# Patient Record
Sex: Female | Born: 1965 | Race: White | Hispanic: No | Marital: Single | Smoking: Former smoker
Health system: Southern US, Community
[De-identification: ages and names within clinical notes are randomized; demographics above are authoritative.]

## PROBLEM LIST (undated history)

## (undated) DIAGNOSIS — E669 Obesity, unspecified: Secondary | ICD-10-CM

## (undated) DIAGNOSIS — K759 Inflammatory liver disease, unspecified: Secondary | ICD-10-CM

## (undated) DIAGNOSIS — E119 Type 2 diabetes mellitus without complications: Secondary | ICD-10-CM

## (undated) DIAGNOSIS — G4733 Obstructive sleep apnea (adult) (pediatric): Secondary | ICD-10-CM

## (undated) DIAGNOSIS — J449 Chronic obstructive pulmonary disease, unspecified: Secondary | ICD-10-CM

## (undated) DIAGNOSIS — K3184 Gastroparesis: Secondary | ICD-10-CM

## (undated) DIAGNOSIS — I6529 Occlusion and stenosis of unspecified carotid artery: Secondary | ICD-10-CM

## (undated) DIAGNOSIS — G4700 Insomnia, unspecified: Secondary | ICD-10-CM

## (undated) DIAGNOSIS — M1990 Unspecified osteoarthritis, unspecified site: Secondary | ICD-10-CM

## (undated) DIAGNOSIS — F32A Depression, unspecified: Secondary | ICD-10-CM

## (undated) DIAGNOSIS — K589 Irritable bowel syndrome without diarrhea: Secondary | ICD-10-CM

## (undated) DIAGNOSIS — M5430 Sciatica, unspecified side: Secondary | ICD-10-CM

## (undated) DIAGNOSIS — F419 Anxiety disorder, unspecified: Secondary | ICD-10-CM

## (undated) DIAGNOSIS — F329 Major depressive disorder, single episode, unspecified: Secondary | ICD-10-CM

## (undated) DIAGNOSIS — E785 Hyperlipidemia, unspecified: Secondary | ICD-10-CM

## (undated) DIAGNOSIS — K7460 Unspecified cirrhosis of liver: Secondary | ICD-10-CM

## (undated) DIAGNOSIS — I10 Essential (primary) hypertension: Secondary | ICD-10-CM

## (undated) DIAGNOSIS — J45909 Unspecified asthma, uncomplicated: Secondary | ICD-10-CM

## (undated) HISTORY — DX: Obstructive sleep apnea (adult) (pediatric): G47.33

## (undated) HISTORY — DX: Depression, unspecified: F32.A

## (undated) HISTORY — DX: Obesity, unspecified: E66.9

## (undated) HISTORY — DX: Type 2 diabetes mellitus without complications: E11.9

## (undated) HISTORY — DX: Irritable bowel syndrome, unspecified: K58.9

## (undated) HISTORY — DX: Insomnia, unspecified: G47.00

## (undated) HISTORY — DX: Essential (primary) hypertension: I10

## (undated) HISTORY — DX: Major depressive disorder, single episode, unspecified: F32.9

## (undated) HISTORY — DX: Hyperlipidemia, unspecified: E78.5

## (undated) HISTORY — PX: OTHER SURGICAL HISTORY: SHX169

## (undated) HISTORY — PX: WISDOM TOOTH EXTRACTION: SHX21

## (undated) HISTORY — DX: Gastroparesis: K31.84

## (undated) HISTORY — DX: Sciatica, unspecified side: M54.30

---

## 1998-10-23 ENCOUNTER — Emergency Department (HOSPITAL_COMMUNITY): Admission: EM | Admit: 1998-10-23 | Discharge: 1998-10-23 | Payer: Self-pay | Admitting: Emergency Medicine

## 1998-11-01 ENCOUNTER — Ambulatory Visit (HOSPITAL_COMMUNITY): Admission: RE | Admit: 1998-11-01 | Discharge: 1998-11-01 | Payer: Self-pay | Admitting: Gastroenterology

## 1998-11-05 ENCOUNTER — Emergency Department (HOSPITAL_COMMUNITY): Admission: EM | Admit: 1998-11-05 | Discharge: 1998-11-05 | Payer: Self-pay | Admitting: Emergency Medicine

## 1998-11-06 ENCOUNTER — Encounter: Payer: Self-pay | Admitting: Gastroenterology

## 1998-11-06 ENCOUNTER — Ambulatory Visit (HOSPITAL_COMMUNITY): Admission: RE | Admit: 1998-11-06 | Discharge: 1998-11-06 | Payer: Self-pay | Admitting: Gastroenterology

## 1999-01-25 ENCOUNTER — Ambulatory Visit (HOSPITAL_BASED_OUTPATIENT_CLINIC_OR_DEPARTMENT_OTHER): Admission: RE | Admit: 1999-01-25 | Discharge: 1999-01-25 | Payer: Self-pay | Admitting: Otolaryngology

## 1999-10-29 ENCOUNTER — Other Ambulatory Visit (HOSPITAL_COMMUNITY): Admission: RE | Admit: 1999-10-29 | Discharge: 1999-11-13 | Payer: Self-pay | Admitting: Psychiatry

## 2002-03-07 ENCOUNTER — Ambulatory Visit (HOSPITAL_BASED_OUTPATIENT_CLINIC_OR_DEPARTMENT_OTHER): Admission: RE | Admit: 2002-03-07 | Discharge: 2002-03-07 | Payer: Self-pay | Admitting: Family Medicine

## 2002-03-07 ENCOUNTER — Encounter: Payer: Self-pay | Admitting: Internal Medicine

## 2002-04-28 ENCOUNTER — Encounter: Payer: Self-pay | Admitting: Internal Medicine

## 2002-04-28 ENCOUNTER — Ambulatory Visit (HOSPITAL_BASED_OUTPATIENT_CLINIC_OR_DEPARTMENT_OTHER): Admission: RE | Admit: 2002-04-28 | Discharge: 2002-04-28 | Payer: Self-pay | Admitting: Family Medicine

## 2002-11-26 ENCOUNTER — Encounter: Payer: Self-pay | Admitting: Internal Medicine

## 2002-11-26 ENCOUNTER — Ambulatory Visit (HOSPITAL_BASED_OUTPATIENT_CLINIC_OR_DEPARTMENT_OTHER): Admission: RE | Admit: 2002-11-26 | Discharge: 2002-11-26 | Payer: Self-pay | Admitting: Family Medicine

## 2005-01-14 ENCOUNTER — Other Ambulatory Visit: Admission: RE | Admit: 2005-01-14 | Discharge: 2005-01-14 | Payer: Self-pay | Admitting: Obstetrics and Gynecology

## 2005-09-13 ENCOUNTER — Ambulatory Visit: Payer: Self-pay | Admitting: Gastroenterology

## 2005-10-14 ENCOUNTER — Ambulatory Visit: Payer: Self-pay | Admitting: Gastroenterology

## 2006-06-13 ENCOUNTER — Encounter: Payer: Self-pay | Admitting: Internal Medicine

## 2006-06-27 ENCOUNTER — Encounter: Admission: RE | Admit: 2006-06-27 | Discharge: 2006-06-27 | Payer: Self-pay | Admitting: Obstetrics and Gynecology

## 2006-08-21 ENCOUNTER — Other Ambulatory Visit: Admission: RE | Admit: 2006-08-21 | Discharge: 2006-08-21 | Payer: Self-pay | Admitting: Obstetrics and Gynecology

## 2007-03-06 ENCOUNTER — Ambulatory Visit (HOSPITAL_COMMUNITY): Admission: RE | Admit: 2007-03-06 | Discharge: 2007-03-06 | Payer: Self-pay | Admitting: Obstetrics and Gynecology

## 2007-07-31 ENCOUNTER — Encounter: Admission: RE | Admit: 2007-07-31 | Discharge: 2007-07-31 | Payer: Self-pay | Admitting: Obstetrics and Gynecology

## 2007-08-24 ENCOUNTER — Other Ambulatory Visit: Admission: RE | Admit: 2007-08-24 | Discharge: 2007-08-24 | Payer: Self-pay | Admitting: Obstetrics and Gynecology

## 2007-10-01 ENCOUNTER — Ambulatory Visit: Payer: Self-pay | Admitting: Internal Medicine

## 2007-10-01 DIAGNOSIS — G4700 Insomnia, unspecified: Secondary | ICD-10-CM | POA: Insufficient documentation

## 2007-10-01 DIAGNOSIS — G4733 Obstructive sleep apnea (adult) (pediatric): Secondary | ICD-10-CM | POA: Insufficient documentation

## 2007-10-04 DIAGNOSIS — I10 Essential (primary) hypertension: Secondary | ICD-10-CM | POA: Insufficient documentation

## 2007-10-04 DIAGNOSIS — J309 Allergic rhinitis, unspecified: Secondary | ICD-10-CM | POA: Insufficient documentation

## 2007-10-29 ENCOUNTER — Ambulatory Visit: Payer: Self-pay | Admitting: Internal Medicine

## 2008-03-16 ENCOUNTER — Telehealth: Payer: Self-pay | Admitting: Gastroenterology

## 2008-03-23 ENCOUNTER — Ambulatory Visit: Payer: Self-pay | Admitting: Gastroenterology

## 2008-03-23 DIAGNOSIS — K3184 Gastroparesis: Secondary | ICD-10-CM | POA: Insufficient documentation

## 2008-03-24 ENCOUNTER — Telehealth: Payer: Self-pay | Admitting: Gastroenterology

## 2008-03-25 ENCOUNTER — Ambulatory Visit: Payer: Self-pay | Admitting: Internal Medicine

## 2008-03-25 ENCOUNTER — Telehealth: Payer: Self-pay | Admitting: Gastroenterology

## 2008-03-29 ENCOUNTER — Telehealth: Payer: Self-pay | Admitting: Gastroenterology

## 2008-03-31 ENCOUNTER — Telehealth: Payer: Self-pay | Admitting: Internal Medicine

## 2008-04-08 ENCOUNTER — Telehealth: Payer: Self-pay | Admitting: Gastroenterology

## 2008-04-18 ENCOUNTER — Encounter: Payer: Self-pay | Admitting: Internal Medicine

## 2008-06-22 ENCOUNTER — Ambulatory Visit: Payer: Self-pay | Admitting: Gastroenterology

## 2008-08-19 ENCOUNTER — Encounter: Admission: RE | Admit: 2008-08-19 | Discharge: 2008-08-19 | Payer: Self-pay | Admitting: Obstetrics and Gynecology

## 2008-08-24 ENCOUNTER — Encounter: Admission: RE | Admit: 2008-08-24 | Discharge: 2008-08-24 | Payer: Self-pay | Admitting: Obstetrics and Gynecology

## 2008-08-30 ENCOUNTER — Other Ambulatory Visit: Admission: RE | Admit: 2008-08-30 | Discharge: 2008-08-30 | Payer: Self-pay | Admitting: Obstetrics and Gynecology

## 2008-09-06 ENCOUNTER — Telehealth: Payer: Self-pay | Admitting: Gastroenterology

## 2008-09-12 ENCOUNTER — Ambulatory Visit: Payer: Self-pay | Admitting: Gastroenterology

## 2008-09-19 ENCOUNTER — Telehealth (INDEPENDENT_AMBULATORY_CARE_PROVIDER_SITE_OTHER): Payer: Self-pay | Admitting: *Deleted

## 2008-09-23 ENCOUNTER — Ambulatory Visit: Payer: Self-pay | Admitting: Internal Medicine

## 2008-10-21 ENCOUNTER — Telehealth: Payer: Self-pay | Admitting: Gastroenterology

## 2009-01-24 ENCOUNTER — Encounter (INDEPENDENT_AMBULATORY_CARE_PROVIDER_SITE_OTHER): Payer: Self-pay | Admitting: *Deleted

## 2009-02-27 ENCOUNTER — Telehealth: Payer: Self-pay | Admitting: Gastroenterology

## 2009-03-06 ENCOUNTER — Encounter: Admission: RE | Admit: 2009-03-06 | Discharge: 2009-03-06 | Payer: Self-pay | Admitting: Obstetrics and Gynecology

## 2009-03-13 ENCOUNTER — Telehealth (INDEPENDENT_AMBULATORY_CARE_PROVIDER_SITE_OTHER): Payer: Self-pay | Admitting: *Deleted

## 2009-06-08 ENCOUNTER — Ambulatory Visit: Payer: Self-pay | Admitting: Gastroenterology

## 2009-06-12 ENCOUNTER — Encounter: Payer: Self-pay | Admitting: Gastroenterology

## 2009-06-12 ENCOUNTER — Telehealth: Payer: Self-pay | Admitting: Gastroenterology

## 2009-08-25 ENCOUNTER — Encounter: Admission: RE | Admit: 2009-08-25 | Discharge: 2009-08-25 | Payer: Self-pay | Admitting: Obstetrics and Gynecology

## 2009-08-30 ENCOUNTER — Telehealth: Payer: Self-pay | Admitting: Internal Medicine

## 2009-09-22 ENCOUNTER — Ambulatory Visit: Payer: Self-pay | Admitting: Internal Medicine

## 2009-10-02 ENCOUNTER — Telehealth (INDEPENDENT_AMBULATORY_CARE_PROVIDER_SITE_OTHER): Payer: Self-pay | Admitting: *Deleted

## 2010-01-09 ENCOUNTER — Encounter: Payer: Self-pay | Admitting: Internal Medicine

## 2010-03-01 ENCOUNTER — Telehealth (INDEPENDENT_AMBULATORY_CARE_PROVIDER_SITE_OTHER): Payer: Self-pay | Admitting: *Deleted

## 2010-05-04 ENCOUNTER — Encounter: Payer: Self-pay | Admitting: Gastroenterology

## 2010-06-05 ENCOUNTER — Other Ambulatory Visit: Admission: RE | Admit: 2010-06-05 | Discharge: 2010-06-05 | Payer: Self-pay | Admitting: Obstetrics and Gynecology

## 2010-09-04 NOTE — Progress Notes (Signed)
Summary: TRIAGE-NAUSEA WITH VOMITTING  Phone Note Call from Patient Call back at [phone]   Caller: MS Pingley-FRIEND Call For: DR Arlyce Dice Reason for Call: Talk to Nurse Summary of Call: IS SO SICK-SHE IS UNABLE TO CALL HERSELF. NAUSEA WITH VOMITTING-UNABLE TO SLEEP THROWING UP ALL NIGHT LONG. DOESNT WANT TO WAIT UNTIL SEPT 15 APPOINTMENT-NEEDS TO BE SEEN  NOW. Initial call taken by: Leanor Kail [facility],  March 16, 2008 3:25 PM  Follow-up for Phone Call        Per pt. friend-Pt. with  N/V x2 weeks. More severe last 2 days. Unable to keep anything down x 24 hours. Mrs. akridge refused to answer any more questions about patient condition. Advise to take pt. to ER. Mrs. varas began to yell, "She needs something done, she needs some tests!" Again advised to take pt. to ER for complete evaluation. I asked to talk to the patient,( I will not talk to Mrs. Mccartney any longer due to her hostility and yelling)  and Mrs. Prioleau hung up on me. Follow-up by: Laureen Ochs LPN,  March 16, 2008 4:02 PM  Additional Follow-up for Phone Call Additional follow up Details #1::        Schedule her to see either Amy or Gunnar Fusi this week Additional Follow-up by: Louis Meckel MD,  March 16, 2008 5:24 PM        Appended Document: TRIAGE-NAUSEA WITH VOMITTING Spoke with patient, she is having episodes of n/v, bloating from her Gastroparesis. States she can't take Reglan because, "It made me feel funny."  Pt. using Phenergan form her PCP, can't use it at work because it makes her too drowsy. Her appt. has been r/s to 03-23-08. DR.Gionna Polak PLEASE ADVISE.  Appended Document: Meds. Update Per Dr.Kerrin Markman-Try Erythromycin 250mg  QID. Meds. to pharmacy. Pt. notified.   Clinical Lists Changes  Medications: Added new medication of ERYTHROMYCIN BASE 250 MG  TABS (ERYTHROMYCIN BASE) Take one by mouth 1/2 hour before breakfast, lunch,dinner and at bedtime. - Signed Rx of ERYTHROMYCIN BASE 250 MG  TABS (ERYTHROMYCIN BASE)  Take one by mouth 1/2 hour before breakfast, lunch,dinner and at bedtime.;  #120 x 6;  Signed;  Entered by: Laureen Ochs LPN;  Authorized by: Louis Meckel MD;  Method used: Electronic    Prescriptions: ERYTHROMYCIN BASE 250 MG  TABS (ERYTHROMYCIN BASE) Take one by mouth 1/2 hour before breakfast, lunch,dinner and at bedtime.  #120 x 6   Entered by:   Laureen Ochs LPN   Authorized by:   Louis Meckel MD   Signed by:   Laureen Ochs LPN on 45/40/9811   Method used:   Electronically sent to ...       [facility] [address]       [address]       [place], [place]  [address]       Ph: [phone]       Fax: [phone]   RxID:   [id number]

## 2010-09-04 NOTE — Progress Notes (Signed)
Summary: sleep meds/cb  Phone Note Call from Patient Call back at Home Phone [phone]   Caller: Patient Call For: young Summary of Call: pt saw cy on 2/18 and was put on trazadone 50mg  for 2 weeks this did not help her sleep so she bumped it up to 100mg  she will need more pills to last her Initial call taken by: Lacinda Axon,  October 02, 2009 12:17 PM  Follow-up for Phone Call        [facility] on [address] and increased quantity for next refill of Trazadone to #60.  Pt is having to take 2 tabs at bedtime in order to get benefit. Abigail Miyamoto RN  October 02, 2009 12:59 PM     Prescriptions: TRAZODONE HCL 50 MG TABS (TRAZODONE HCL) 1 at bedtime. After 2 weeks, increase to 2 at bedtime if needed.  #60 x 4   Entered by:   Abigail Miyamoto RN   Authorized by:   Waymon Budge MD   Signed by:   Abigail Miyamoto RN on 10/02/2009   Method used:   Telephoned to ...       [facility] [place] [address] (retail)       [phone]       [place], [place]  [address]       Ph: [phone]       Fax: [phone]   RxID:   [id number]

## 2010-09-04 NOTE — Assessment & Plan Note (Signed)
Summary: 12 months/apc   Copy to:  n/a Primary Provider/Referring Provider:  Olivia Canter, MD   CC:  Follow up visit-sleep; Clonazepam not working anymore. Marland Kitchen  History of Present Illness: 03/25/08- 45 year old woman returning for follow-up of allergic rhinitis, insomnia, with sleep apnea.  She is being evaluated by GI for gastroparesis.  Stopped smoking by using Nicorette gum.  Had a bronchitis episode with cough and hoarseness treated at her primary office with prednisone and cough medicine.  This is now resolving.  Continues CPAP at 12 C. WP.  The pressure is comfortable, but the machine has a "warm smell"  in the morning, which is getting worse.  09/23/08- OSA,Insomnia, rhinitis Uses cpap every night. New machine humidifier doesn't hold quite enough water to last her through the night. Had frequent viral colds this winter- went to her primary. Had blood test for allergy found allergic to her cats.   September 22, 2009-- OSA/ insomnia, rhinitis Sees Dr Arlyce Dice for nondiabetic gastroparesis Uses CPAP every night 11 cwp. Works well. Humidifier helped during recent URI. Has trouble falling asleep and staying asleep despite clonazepam 4 mg at HS. She asks about trying trrazadone. We discussed weaning down on clonazepam with this. had flu vax   Current Medications (verified): 1)  Lisinopril-Hydrochlorothiazide 10-12.5 Mg Tabs (Lisinopril-Hydrochlorothiazide) .... Take 1 Tablet By Mouth Once A Day 2)  Vit A/d Cod Liver Oil 4000-200 Unit Caps (Vitamins A & D) .Marland Kitchen.. 1 By Mouth Once Daily 3)  Melatonin 3mg  .... 1  Tablet At Bedtime 4)  Multivitamins   Tabs (Multiple Vitamin) .... Once Daily 5)  Fish Oil 1200 Mg  Caps (Omega-3 Fatty Acids) .... Two Times A Day 6)  Cpap 11 Advanced .... As Directed 7)  Clonazepam 1 Mg Tabs (Clonazepam) .... 3-4 For Sleep If Needed 8)  Promethazine Hcl 25 Mg  Tabs (Promethazine Hcl) .... Take One Every 4-6 Hours As Needed For Nausea 9)  Astepro 0.15 % Soln (Azelastine  Hcl) .Marland Kitchen.. 1-2 Puffs Each Nostril Daily 10)  Fluticasone Propionate 50 Mcg/act Susp (Fluticasone Propionate) .Marland Kitchen.. 1-2 Sprays Each Nostril Daily 11)  Zofran 4 Mg  Tabs (Ondansetron Hcl) .... Take One Every 8 Hours As Needed For Nausea. 12)  Clobetasol Propionate 0.05 % Crea (Clobetasol Propionate) .... Use As Directed 13)  Neosulus Cream .... Use As Directed  Allergies (verified): No Known Drug Allergies  Past History:  Past Medical History: Last updated: 03/21/2008 Obstructive sleep apnea- NPSG 03/07/02 insufficient sleep; 04/28/02 AHI 20/hr; 11/26/02 CPAP 9 Insomnia Allergic Rhinitis Hypertension Gastroparesis Irritable bowel syndrome Depression Hyperlipidemia  Family History: Last updated: 03/23/2008 Family History of Clotting disorder: Mother Lung cancer-aunt No FH of Colon Cancer: Family History of Liver Cancer: Uncle  Social History: Last updated: 06/08/2009 Not married Patient is a former smoker.  Alcohol Use - no Daily Caffeine Use Occupation: [employer]   Risk Factors: Caffeine Use: 3 (03/23/2008)  Risk Factors: Smoking Status: quit (03/23/2008) Packs/Day: 1ppd (10/01/2007)  Past Surgical History: Wisdom teeth  Review of Systems      See HPI  The patient denies anorexia, fever, weight loss, weight gain, vision loss, decreased hearing, hoarseness, chest pain, syncope, dyspnea on exertion, peripheral edema, prolonged cough, headaches, hemoptysis, abdominal pain, and severe indigestion/heartburn.    Vital Signs:  Patient profile:   45 year old female Height:      63 inches Weight:      316.13 pounds BMI:     56.20 O2 Sat:      97 %  on Room air Pulse rate:   107 / minute BP sitting:   128 / 80  (left arm) Cuff size:   large  Vitals Entered By: Reynaldo Minium CMA (September 22, 2009 2:31 PM)  O2 Flow:  Room air  Physical Exam  Additional Exam:  General: A/Ox3; pleasant and cooperative, NAD, obese, alert, talkative SKIN: no rash, lesions NODES:  no lymphadenopathy HEENT: [place]/AT, EOM- WNL, Conjuctivae- clear, PERRLA, TM-WNL, Nose- clear, Throat- clear and wnl, [place]  III NECK: Supple w/ fair ROM, JVD- none, normal carotid impulses w/o bruits Thyroid- normal to palpation CHEST: Clear to P&A HEART: RRR, no m/g/r heard ABDOMEN: Soft and nl;  ZOX:WRUE, nl pulses, no edema  NEURO: Grossly intact to observation      Impression & Recommendations:  Problem # 1:  SLEEP APNEA (ICD-780.57) Morbid obesity is the underlying problem. She has been fully compliant and well controlled on cpap at current pressure.  Problem # 2:  INSOMNIA (ICD-780.52)  She has deveoped tolerance to clonazepam partly with stress of losing her [employer] job. We will try trazodone.  Medications Added to Medication List This Visit: 1)  Clobetasol Propionate 0.05 % Crea (Clobetasol propionate) .... Use as directed 2)  Neosulus Cream  .... Use as directed 3)  Trazodone Hcl 50 Mg Tabs (Trazodone hcl) .Marland Kitchen.. 1 at bedtime. after 2 weeks, increase to 2 at bedtime if needed.  Other Orders: Est. Patient Level III ([address])  Patient Instructions: 1)  Schedule return in one year, earlier if needed 2)  Try trazadone, starting with one per night x 2 weeks, then increasing to 2 per night if needed.  3)  With this, reduce clonazepam to 2 mg per night. 4)  Call as needed. Prescriptions: FLUTICASONE PROPIONATE 50 MCG/ACT SUSP (FLUTICASONE PROPIONATE) 1-2 sprays each nostril daily  #1 x prn   Entered and Authorized by:   Waymon Budge MD   Signed by:   Waymon Budge MD on 09/22/2009   Method used:   Electronically to        [facility] [address] (retail)       [phone]       [place], [place]  [address]       Ph: [phone]       Fax: [phone]   RxID:   [id number] FLUTICASONE PROPIONATE 50 MCG/ACT SUSP (FLUTICASONE PROPIONATE) 1-2 sprays each nostril daily  #1 x prn   Entered and Authorized by:   Waymon Budge MD   Signed by:   Waymon Budge MD on 09/22/2009   Method used:   Print then Give to Patient   RxID:   [id number] TRAZODONE HCL 50 MG TABS (TRAZODONE HCL) 1 at bedtime. After 2 weeks, increase to 2 at bedtime if needed.  #50 x 5   Entered and Authorized by:   Waymon Budge MD   Signed by:   Waymon Budge MD on 09/22/2009   Method used:   Print then Give to Patient   RxID:   [id number]

## 2010-09-04 NOTE — Letter (Signed)
Summary: [facility]  [facility]   Imported By: Lester Greenwood 01/12/2010 09:37:39  _____________________________________________________________________  External Attachment:    Type:   Image     Comment:   External Document

## 2010-09-04 NOTE — Progress Notes (Signed)
Summary: prescript clonazepam   Phone Note Call from Patient   Caller: Patient Call For: young Summary of Call: need to pick up prescript for clonazepam 1mg . Initial call taken by: Rickard Patience,  March 01, 2010 1:27 PM  Follow-up for Phone Call        Clonazepam 1mg  rx last sent on 08/30/09 #120 x 6.  Will forward message to Dr. Maple Hudson, pls advise if ok for pt to have rx.  Thanks! Gweneth Dimitri RN  March 01, 2010 1:44 PM   Additional Follow-up for Phone Call Additional follow up Details #1::        OK to refill this pending scheduled return visit. Additional Follow-up by: Waymon Budge MD,  March 01, 2010 5:21 PM    Additional Follow-up for Phone Call Additional follow up Details #2::    Pt has pending appt for Sep 21, 2010.  Clonazepam printed and placed on CY's cart for signaute.  WIll forward message to him as FYI.  Gweneth Dimitri RN  March 01, 2010 5:24 PM    RX is at front desk for pick up. LMOVM that this is done.Reynaldo Minium CMA  March 02, 2010 8:52 AM    Prescriptions: CLONAZEPAM 1 MG TABS (CLONAZEPAM) 3-4 for sleep if needed  #120 x 6   Entered by:   Gweneth Dimitri RN   Authorized by:   Waymon Budge MD   Signed by:   Gweneth Dimitri RN on 03/01/2010   Method used:   Print then Give to Patient   RxID:   [id number]

## 2010-09-04 NOTE — Progress Notes (Signed)
Summary: refill  Phone Note Call from Patient   Caller: Patient Call For: young Summary of Call: need refill for clonazepam she will pick up pt have appt on 2/18 Initial call taken by: Rickard Patience,  August 30, 2009 8:51 AM  Follow-up for Phone Call         rx printed off for CY to sign. Zackery Barefoot CMA  August 30, 2009 9:15 AM   rx signed, pt informed, rx placed at front desk. Zackery Barefoot CMA  August 30, 2009 9:35 AM     Prescriptions: CLONAZEPAM 1 MG TABS (CLONAZEPAM) 3-4 for sleep if needed  #120 x 6   Entered by:   Zackery Barefoot CMA   Authorized by:   Waymon Budge MD   Signed by:   Zackery Barefoot CMA on 08/30/2009   Method used:   Print then Give to Patient   RxID:   [id number]

## 2010-09-04 NOTE — Letter (Signed)
Summary: Office Visit Letter  [facility]  [address] [place], [place] [address]   Phone: [phone]  Fax: [phone]      May 04, 2010 MRN: [id number]   [facility] [address] [place], [place]  [address]   Dear Ms. Dan Humphreys,   According to our records, it is time for you to schedule a follow-up office visit with [place] in the month of November 2011.   At your convenience, please call [phone] (option #2)to schedule an office visit. If you have any questions, concerns, or feel that this letter is in error, we would appreciate your call.   Sincerely,  Barbette Hair. Arlyce Dice, M.D.   [facility] [phone]

## 2010-09-21 ENCOUNTER — Ambulatory Visit (INDEPENDENT_AMBULATORY_CARE_PROVIDER_SITE_OTHER): Payer: BC Managed Care – PPO | Admitting: Internal Medicine

## 2010-09-21 ENCOUNTER — Encounter: Payer: Self-pay | Admitting: Internal Medicine

## 2010-09-21 DIAGNOSIS — G4730 Sleep apnea, unspecified: Secondary | ICD-10-CM

## 2010-09-21 DIAGNOSIS — J309 Allergic rhinitis, unspecified: Secondary | ICD-10-CM

## 2010-09-21 DIAGNOSIS — G4700 Insomnia, unspecified: Secondary | ICD-10-CM

## 2010-10-01 ENCOUNTER — Other Ambulatory Visit (HOSPITAL_COMMUNITY): Payer: BC Managed Care – PPO

## 2010-10-02 NOTE — Assessment & Plan Note (Signed)
Summary: 12 months//apc   Copy to:  n/a Primary Provider/Referring Provider:  Olivia Canter, MD   CC:  Yearly follow up visit-Sleep Apnea and allergies. Uses CPAP each night and no complaints..  History of Present Illness: 09/23/08- OSA,Insomnia, rhinitis Uses cpap every night. New machine humidifier doesn't hold quite enough water to last her through the night. Had frequent viral colds this winter- went to her primary. Had blood test for allergy found allergic to her cats.   September 22, 2009-- OSA/ insomnia, rhinitis Sees Dr Arlyce Dice for nondiabetic gastroparesis Uses CPAP every night 11 cwp. Works well. Humidifier helped during recent URI. Has trouble falling asleep and staying asleep despite clonazepam 4 mg at HS. She asks about trying trazadone. We discussed weaning down on clonazepam with this. had flu vax  September 21, 2010- OSA/ insomnia, rhinitis Nurse-CC: Yearly follow up visit-Sleep Apnea and allergies. Uses CPAP each night and no complaints. CPAP-11- doing well. Nasal mask. Advanced. Tried different mask styles. Not snoring. Sleep is fragemnted by perimenopausal. Has clonazepam taking 3-4 mg/ night.Nothing else worked on a sustained basis for her.  Occasional daytime sleepiness. Does get sleepy in afternoon class. Discussed timing of exercise, caffeine. Has lost 50 lbs with diet and exercsie.    Preventive Screening-Counseling & Management  Alcohol-Tobacco     Smoking Status: quit     Packs/Day: 1ppd     Year Started: 1985     Year Quit: 2007    Current Medications (verified): 1)  Multivitamins   Tabs (Multiple Vitamin) .... Once Daily 2)  Fish Oil 1200 Mg  Caps (Omega-3 Fatty Acids) .... Two Times A Day 3)  Cpap 11 Advanced .... As Directed 4)  Clonazepam 1 Mg Tabs (Clonazepam) .... 3-4 For Sleep If Needed 5)  Fluticasone Propionate 50 Mcg/act Susp (Fluticasone Propionate) .Marland Kitchen.. 1-2 Sprays Each Nostril Daily  Allergies (verified): No Known Drug Allergies  Past  History:  Past Medical History: Last updated: 03/21/2008 Obstructive sleep apnea- NPSG 03/07/02 insufficient sleep; 04/28/02 AHI 20/hr; 11/26/02 CPAP 9 Insomnia Allergic Rhinitis Hypertension Gastroparesis Irritable bowel syndrome Depression Hyperlipidemia  Past Surgical History: Last updated: 09/22/2009 Wisdom teeth  Family History: Last updated: 03/23/2008 Family History of Clotting disorder: Mother Lung cancer-aunt No FH of Colon Cancer: Family History of Liver Cancer: Uncle  Social History: Last updated: 06/08/2009 Not married Patient is a former smoker.  Alcohol Use - no Daily Caffeine Use Occupation: [employer]   Risk Factors: Caffeine Use: 3 (03/23/2008)  Risk Factors: Smoking Status: quit (09/21/2010) Packs/Day: 1ppd (09/21/2010)  Review of Systems      See HPI       The patient complains of weight change.  The patient denies shortness of breath with activity, shortness of breath at rest, productive cough, non-productive cough, coughing up blood, chest pain, irregular heartbeats, acid heartburn, indigestion, loss of appetite, abdominal pain, difficulty swallowing, sore throat, tooth/dental problems, headaches, nasal congestion/difficulty breathing through nose, and sneezing.    Vital Signs:  Patient profile:   45 year old female Height:      63 inches Weight:      266 pounds BMI:     47.29 O2 Sat:      97 % on Room air Pulse rate:   99 / minute BP sitting:   140 / 72  (left arm) Cuff size:   large  Vitals Entered By: Reynaldo Minium CMA (September 21, 2010 3:14 PM)  O2 Flow:  Room air CC: Yearly follow up visit-Sleep Apnea and allergies.  Uses CPAP each night and no complaints.   Physical Exam  Additional Exam:  General: A/Ox3; pleasant and cooperative, NAD, obese, alert, talkative  SKIN: no rash, lesions NODES: no lymphadenopathy HEENT: [place]/AT, EOM- WNL, Conjuctivae- clear, PERRLA, TM-WNL, Nose- clear, Throat- clear and wnl, Mallampati   III NECK: Supple w/ fair ROM, JVD- none, normal carotid impulses w/o bruits Thyroid- normal to palpation CHEST: Clear to P&A HEART: RRR, no m/g/r heard ABDOMEN: Soft and nl;  ZOX:WRUE, nl pulses, no edema  NEURO: Grossly intact to observation      Impression & Recommendations:  Problem # 1:  SLEEP APNEA (ICD-780.57)  Sleep apnea control is good with CPAP and compliance is good. It further helps that she has lost some weight.   Orders: Est. Patient Level III ([address])  Problem # 2:  INSOMNIA (ICD-780.52)  Insomnia pattern with frequent waking at night, but sleepy in day. consider if MSLT needed looking for primary hypersomnia. She continues clonazepam. i don't think it's long half-life is part of the problem.   Orders: Est. Patient Level III (81191)  Problem # 3:  ALLERGIC RHINITIS (ICD-477.9)  asks refill fluticasone. She realizes she is allergic to her cats. We discussed environmental precautions.  The following medications were removed from the medication list:    Promethazine Hcl 25 Mg Tabs (Promethazine hcl) .Marland Kitchen... Take one every 4-6 hours as needed for nausea    Astepro 0.15 % Soln (Azelastine hcl) .Marland Kitchen... 1-2 puffs each nostril daily Her updated medication list for this problem includes:    Fluticasone Propionate 50 Mcg/act Susp (Fluticasone propionate) .Marland Kitchen... 1-2 sprays each nostril daily  Orders: Est. Patient Level III ([address])  Patient Instructions: 1)  Please schedule a follow-up appointment in 1 year. 2)  Continue CPAP 3)  Ok to continue clonazepam, but always try for the least necessary 4)  I will create a letter affirming your medical use of clonazepam and CPAP. 5)  Scripts for clonazepam and for fluticasone nasal spray Prescriptions: FLUTICASONE PROPIONATE 50 MCG/ACT SUSP (FLUTICASONE PROPIONATE) 1-2 sprays each nostril daily  #1 x prn   Entered and Authorized by:   Waymon Budge MD   Signed by:   Waymon Budge MD on 09/21/2010   Method used:   Print then  Give to Patient   RxID:   [id number] CLONAZEPAM 1 MG TABS (CLONAZEPAM) 3-4 for sleep if needed  #120 x 6   Entered and Authorized by:   Waymon Budge MD   Signed by:   Waymon Budge MD on 09/21/2010   Method used:   Print then Give to Patient   RxID:   [id number]

## 2010-10-03 ENCOUNTER — Other Ambulatory Visit (HOSPITAL_COMMUNITY): Payer: BC Managed Care – PPO

## 2010-10-08 ENCOUNTER — Other Ambulatory Visit (HOSPITAL_COMMUNITY): Payer: BC Managed Care – PPO

## 2010-10-10 ENCOUNTER — Other Ambulatory Visit: Payer: Self-pay | Admitting: Obstetrics and Gynecology

## 2010-10-10 ENCOUNTER — Ambulatory Visit (HOSPITAL_COMMUNITY)
Admission: RE | Admit: 2010-10-10 | Payer: BC Managed Care – PPO | Source: Ambulatory Visit | Admitting: Obstetrics and Gynecology

## 2011-03-13 ENCOUNTER — Other Ambulatory Visit: Payer: Self-pay | Admitting: Internal Medicine

## 2011-03-13 ENCOUNTER — Telehealth: Payer: Self-pay | Admitting: Internal Medicine

## 2011-03-13 MED ORDER — CLONAZEPAM 1 MG PO TABS: ORAL_TABLET | ORAL | Status: DC

## 2011-03-13 NOTE — Telephone Encounter (Signed)
Ok to send her enough for 10 days

## 2011-03-13 NOTE — Telephone Encounter (Signed)
Spoke with the pt and she states her dog ate her clonazepam and she cannot get her med filled until 8-17 and she states she is starting to get shakes, and night sweats and is asking for enough tablets to be called in to get her to her next refill on 8-17. Pt last ov was 09-21-10.  Please advise if ok to refill.Carron Curie, CMA

## 2011-03-13 NOTE — Telephone Encounter (Signed)
Spoke with pt and notified of recs per CDY. She verbalized understanding and rx was called to pharm.

## 2011-03-18 ENCOUNTER — Telehealth: Payer: Self-pay | Admitting: Gastroenterology

## 2011-03-18 NOTE — Telephone Encounter (Signed)
Please advise if okay to refill Rx as requested. Thanks.

## 2011-03-18 NOTE — Telephone Encounter (Signed)
Ok to refill clonazepam until next appointment in February

## 2011-03-18 NOTE — Telephone Encounter (Signed)
Pt states she is having abdominal pain and bloating. The pain is on her right side and she thinks it may be her gallbladder. Pt is tearful. Appt made for pt to see Willette Cluster NP 03/19/11@2pm . Pt aware of appt date and time.

## 2011-03-19 ENCOUNTER — Encounter: Payer: Self-pay | Admitting: Nurse Practitioner

## 2011-03-19 ENCOUNTER — Other Ambulatory Visit (INDEPENDENT_AMBULATORY_CARE_PROVIDER_SITE_OTHER): Payer: BC Managed Care – PPO

## 2011-03-19 ENCOUNTER — Ambulatory Visit: Payer: BC Managed Care – PPO | Admitting: Physician Assistant

## 2011-03-19 ENCOUNTER — Ambulatory Visit (INDEPENDENT_AMBULATORY_CARE_PROVIDER_SITE_OTHER): Payer: BC Managed Care – PPO | Admitting: Nurse Practitioner

## 2011-03-19 DIAGNOSIS — R109 Unspecified abdominal pain: Secondary | ICD-10-CM

## 2011-03-19 DIAGNOSIS — R143 Flatulence: Secondary | ICD-10-CM

## 2011-03-19 DIAGNOSIS — R197 Diarrhea, unspecified: Secondary | ICD-10-CM

## 2011-03-19 DIAGNOSIS — R141 Gas pain: Secondary | ICD-10-CM

## 2011-03-19 DIAGNOSIS — R140 Abdominal distension (gaseous): Secondary | ICD-10-CM

## 2011-03-19 DIAGNOSIS — R1907 Generalized intra-abdominal and pelvic swelling, mass and lump: Secondary | ICD-10-CM

## 2011-03-19 LAB — COMPREHENSIVE METABOLIC PANEL
AST: 117 U/L — ABNORMAL HIGH (ref 0–37)
Alkaline Phosphatase: 213 U/L — ABNORMAL HIGH (ref 39–117)
BUN: 4 mg/dL — ABNORMAL LOW (ref 6–23)
Creatinine, Ser: 0.5 mg/dL (ref 0.4–1.2)
Glucose, Bld: 135 mg/dL — ABNORMAL HIGH (ref 70–99)
Total Bilirubin: 4.1 mg/dL — ABNORMAL HIGH (ref 0.3–1.2)

## 2011-03-19 LAB — CBC WITH DIFFERENTIAL/PLATELET
Basophils Relative: 1.2 % (ref 0.0–3.0)
Eosinophils Absolute: 0.1 10*3/uL (ref 0.0–0.7)
Eosinophils Relative: 0.6 % (ref 0.0–5.0)
Hemoglobin: 14.7 g/dL (ref 12.0–15.0)
MCHC: 34 g/dL (ref 30.0–36.0)
MCV: 111.3 fl — ABNORMAL HIGH (ref 78.0–100.0)
Monocytes Absolute: 1.2 10*3/uL — ABNORMAL HIGH (ref 0.1–1.0)
Neutro Abs: 9 10*3/uL — ABNORMAL HIGH (ref 1.4–7.7)
Neutrophils Relative %: 63.5 % (ref 43.0–77.0)
RBC: 3.87 Mil/uL (ref 3.87–5.11)
WBC: 14.2 10*3/uL — ABNORMAL HIGH (ref 4.5–10.5)

## 2011-03-19 LAB — PROTIME-INR: INR: 1.2 ratio — ABNORMAL HIGH (ref 0.8–1.0)

## 2011-03-19 NOTE — Patient Instructions (Addendum)
Please go to the basement level to have your labs drawn.  We scheduled the Ultrasound at [facility].  Directions and brochure provided. Stay on clear liquids today and tonight. Stay hydrated.  Continue the Zofran as needed.  We will call you with the ultrasound and lab results.

## 2011-03-19 NOTE — Progress Notes (Signed)
03/19/2011 Barbara Douglas [id number] November 25, 1965   HISTORY OF PRESENT ILLNESS: Patient is a 45 year old female known to Dr. Arlyce Dice for a history of gastroparesis. For chronic nausea see takes Phenergan as needed. She couldn't tolerate Domperiodone secondary to headaches.  Patient was last seen in 2010. She had been doing well up until about 3 weeks ago when she developed nausea, vomiting, diarrhea and abdominal distention. She has diffuse abdominal pain, almost constant, wakes her up at night. She takes Pepto-Bismol. The vomiting has improved, she is able to tolerate fluids. Still still has the diarrhea, sometimes up to 8 loose bowel movements a day. No fevers at home. No antibiotics within the last few months. Her partner had transient nausea, vomiting, and diarrhea but quickly recovered. Patient is greatly concerned about abdominal distention which seems to have occurred just over the last few weeks. Of note, in February the patient's weight was 266 when she saw her pulmonologist Dr. Maple Hudson. Today it is 318.   Past Medical History  Diagnosis Date  . Obstructive sleep apnea   . Insomnia   . Allergic rhinitis   . Hypertension   . Gastroparesis   . Irritable bowel syndrome   . Depression   . Hyperlipidemia    Past Surgical History  Procedure Date  . Wisdom tooth extraction   . Uterine polyps     removed  12/2010    reports that she has been smoking.  She has never used smokeless tobacco. She reports that she does not drink alcohol or use illicit drugs. family history includes Clotting disorder in her mother; Heart disease in an unspecified family member; Liver cancer in her maternal uncle; and Lung cancer in her maternal aunt. Allergies  Allergen Reactions  . Ultram (Tramadol Hcl)       Outpatient Encounter Prescriptions as of 03/19/2011  Medication Sig Dispense Refill  . clonazePAM (KLONOPIN) 1 MG tablet Take 3 to 4 tablets at bedtime as needed for sleep  40 tablet  0  . fish  oil-omega-3 fatty acids 1000 MG capsule Take 2 g by mouth daily. As needed      . fluticasone (FLOVENT DISKUS) 50 MCG/BLIST diskus inhaler Inhale 1 puff into the lungs 2 (two) times daily. 1-2 sprays in each nostril daily       . hydrochlorothiazide 25 MG tablet Take 25 mg by mouth daily.        . Multiple Vitamin (MULTIVITAMIN) tablet Take 1 tablet by mouth daily.           REVIEW OF SYSTEMS  : Urine darker than normal lately. All other systems reviewed and negative except where noted in the History of Present Illness.   PHYSICAL EXAM: General:  Obese, white female in no acute distress Head: Normocephalic and atraumatic Eyes: no appreciable icterus. Conjunctive pink. Ears: Normal auditory acuity Mouth: No deformity or lesions Neck: Supple, no masses.  Lungs: Clear throughout to auscultation Heart: Tachycardic. Abdomen: Soft, mild RUQ tenderness. Abdomen obese vrs. distended without tympany. Actually whole abdomen is flat to percussion (? Fluid filled). Difficult to assess for organomegaly but suspect markedly enlarged liver.  Normal bowel sounds Rectal: not done Musculoskeletal: Symmetrical with no gross deformities  Skin: No lesions on visible extremities Extremities: No edema or deformities noted Neurological: Alert oriented x 4, grossly nonfocal Cervical Nodes:  No significant cervical adenopathy Psychological:  Alert and cooperative. Crying periodically with abdominal discomfort and also worry /anxiety  ASSESSMENT AND PLAN;

## 2011-03-20 ENCOUNTER — Telehealth: Payer: Self-pay | Admitting: *Deleted

## 2011-03-20 ENCOUNTER — Ambulatory Visit
Admission: RE | Admit: 2011-03-20 | Discharge: 2011-03-20 | Disposition: A | Discharge status: Home / Self Care | Payer: BC Managed Care – PPO | Source: Ambulatory Visit | Attending: Nurse Practitioner | Admitting: Nurse Practitioner

## 2011-03-20 ENCOUNTER — Telehealth: Payer: Self-pay | Admitting: Internal Medicine

## 2011-03-20 DIAGNOSIS — R109 Unspecified abdominal pain: Secondary | ICD-10-CM

## 2011-03-20 DIAGNOSIS — R140 Abdominal distension (gaseous): Secondary | ICD-10-CM

## 2011-03-20 DIAGNOSIS — R1907 Generalized intra-abdominal and pelvic swelling, mass and lump: Secondary | ICD-10-CM

## 2011-03-20 NOTE — Telephone Encounter (Signed)
Called to check on patient as per Willette Cluster, NP. Patient states she is exhausted after having the Ultrasound. She has had diarrhea 8 times today(small amounts) Lower abdomen constant pressure which kept her up last night. Pain is not a bad today. She has eaten more today.Willette Cluster, NP aware. Orders for ultrasound guided paracentesis, remove 3-4 liters, Fluid for: cell count, gram stain, culture and sensitivity, albumin, cytology. After patient has paracentesis, have CT  Abdomen and pelvis with contrast. Scheduled patient for Ultrasound guided paracentesis at [facility] on 03/21/11 at 9:00 AM. Patient to come for lab prior to [place]. CT scan scheduled at [facility] CT on 03/25/11 at 2:00 PM. NPO 4 hours prior and drink contrast 2 and 1 hour prior. Reviewed above with patient. Instructions and contrast up front for patient to pick up in AM.

## 2011-03-20 NOTE — Telephone Encounter (Signed)
I spoke with the pharmacy and verified directions as take 3 to 4 tablets daily as needed according to last ov note.Carron Curie, CMA

## 2011-03-21 ENCOUNTER — Ambulatory Visit (HOSPITAL_COMMUNITY)
Admission: RE | Admit: 2011-03-21 | Discharge: 2011-03-21 | Disposition: A | Discharge status: Home / Self Care | Payer: BC Managed Care – PPO | Source: Ambulatory Visit | Attending: Nurse Practitioner | Admitting: Nurse Practitioner

## 2011-03-21 ENCOUNTER — Telehealth: Payer: Self-pay | Admitting: Nurse Practitioner

## 2011-03-21 ENCOUNTER — Encounter: Payer: Self-pay | Admitting: Nurse Practitioner

## 2011-03-21 ENCOUNTER — Other Ambulatory Visit (INDEPENDENT_AMBULATORY_CARE_PROVIDER_SITE_OTHER): Payer: BC Managed Care – PPO

## 2011-03-21 ENCOUNTER — Other Ambulatory Visit: Payer: Self-pay | Admitting: Interventional Radiology

## 2011-03-21 DIAGNOSIS — R140 Abdominal distension (gaseous): Secondary | ICD-10-CM

## 2011-03-21 DIAGNOSIS — R109 Unspecified abdominal pain: Secondary | ICD-10-CM | POA: Insufficient documentation

## 2011-03-21 DIAGNOSIS — K7689 Other specified diseases of liver: Secondary | ICD-10-CM | POA: Insufficient documentation

## 2011-03-21 DIAGNOSIS — R188 Other ascites: Secondary | ICD-10-CM | POA: Insufficient documentation

## 2011-03-21 DIAGNOSIS — R197 Diarrhea, unspecified: Secondary | ICD-10-CM | POA: Insufficient documentation

## 2011-03-21 DIAGNOSIS — R1907 Generalized intra-abdominal and pelvic swelling, mass and lump: Secondary | ICD-10-CM | POA: Insufficient documentation

## 2011-03-21 LAB — BODY FLUID CELL COUNT WITH DIFFERENTIAL
Lymphs, Fluid: 47 %
Neutrophil Count, Fluid: 1 % (ref 0–25)
Other Cells, Fluid: 0 %

## 2011-03-21 LAB — PATHOLOGIST SMEAR REVIEW

## 2011-03-21 LAB — ALBUMIN, FLUID (OTHER): Albumin, Fluid: 0.4 g/dL

## 2011-03-21 NOTE — Assessment & Plan Note (Signed)
Generalized abdominal pain associated with development of significant distention over last couple of weeks. Also had 50 pound weight gain over last several month. Recent nausea and vomiting but that is chronic, patient has history of gastroparesis. I do not suspect bowel obstruction, especially since nausea has subsided.  Rule out new onset ascites. She could have cirrhosis or malignancy. Will obtain basic labs and U/S of abdomen. If ascitic fluid present will send for analysis (albumin, gram stain, cytology, etc...) . I will have her come by for serum albumin on day of Ultrasound so that SAAG can be accurately calculated. She should have about 4 liters removed to make her more comfortable. Following ultrasound she will get CTscan abd/pelvis to evaluate for any malignancies. I will call her with test results as they become available.

## 2011-03-21 NOTE — Telephone Encounter (Signed)
Patient wanted to be sure she understood she needed to have labs and ultrasound on the same day. She did this and wanted to be sure this was correct. Told her this is correct. States the she is not hurting as bad now that she had the paracentesis.

## 2011-03-21 NOTE — Assessment & Plan Note (Signed)
Three week history of diarrhea. Rule out infectious etiology. Will obtain stool studies and labs.

## 2011-03-22 LAB — CLOSTRIDIUM DIFFICILE BY PCR: Toxigenic C. Difficile by PCR: DETECTED — CR

## 2011-03-24 LAB — BODY FLUID CULTURE

## 2011-03-25 ENCOUNTER — Telehealth: Payer: Self-pay | Admitting: *Deleted

## 2011-03-25 ENCOUNTER — Ambulatory Visit (INDEPENDENT_AMBULATORY_CARE_PROVIDER_SITE_OTHER)
Admission: RE | Admit: 2011-03-25 | Discharge: 2011-03-25 | Disposition: A | Discharge status: Home / Self Care | Payer: BC Managed Care – PPO | Source: Ambulatory Visit | Attending: Nurse Practitioner | Admitting: Nurse Practitioner

## 2011-03-25 DIAGNOSIS — R188 Other ascites: Secondary | ICD-10-CM

## 2011-03-25 MED ORDER — METRONIDAZOLE 500 MG PO TABS: 500.0000 mg | ORAL_TABLET | Freq: Three times a day (TID) | ORAL | Status: AC

## 2011-03-25 MED ORDER — IOHEXOL 300 MG/ML  SOLN: 100.0000 mL | Freq: Once | INTRAMUSCULAR | Status: DC | PRN

## 2011-03-25 MED ORDER — IOHEXOL 300 MG/ML  SOLN
200.0000 mL | Freq: Once | INTRAMUSCULAR | Status: AC | PRN
  Administered 2011-03-25: 200 mL via INTRAVENOUS

## 2011-03-25 NOTE — Telephone Encounter (Signed)
Spoke with patient and gave her the results and Rx sent to pharmacy

## 2011-03-25 NOTE — Telephone Encounter (Signed)
Patient called back and left a message to call her. Called and left her a message again

## 2011-03-25 NOTE — Telephone Encounter (Signed)
Per Willette Cluster, NP, patient is positive for C.DIff and will need Flagyl 500 mg TID x 2 weeks. Left a message for patient to call me

## 2011-03-25 NOTE — Progress Notes (Signed)
i agree with the plan outlined in this note 

## 2011-03-25 NOTE — Progress Notes (Signed)
Jesse Fall RN left a message to patient today 03-25-2011 regarding Willette Cluster ACNP prescribing Flagyl 500 mg TID for 14 days due to the positive C Diff results.

## 2011-03-26 ENCOUNTER — Telehealth: Payer: Self-pay | Admitting: *Deleted

## 2011-03-26 MED ORDER — FUROSEMIDE 20 MG PO TABS: 20.0000 mg | ORAL_TABLET | Freq: Every day | ORAL | Status: DC

## 2011-03-26 MED ORDER — SPIRONOLACTONE 50 MG PO TABS: 50.0000 mg | ORAL_TABLET | Freq: Every day | ORAL | Status: DC

## 2011-03-26 NOTE — Telephone Encounter (Signed)
Message copied by Daphine Deutscher on Tue Mar 26, 2011  3:44 PM ------      Message from: Meredith Pel      Created: Tue Mar 26, 2011  3:29 PM       Though this could still be cirrhosis but liver doesn't appear cirrhotic on imaging. Ovaries not mentioned. Rene Kocher got pelvis ultrasound from GYN January 2012 and ovaries appear normal  but endometrium thickened with hyperechoic mass which was stable in size from 2009. I saw in echart that endometrial biopsies in March 2012 were negative for malignancy . Her ascitic fluid is negative for ascites. Her SAAG is compatible with portal hypertension. Ivie Maese, please start 2gm sodium diet and Lasix 20mg  daily and Aldactone 50mg  daily. She needs CMET and CBC in a week. Thanks

## 2011-03-26 NOTE — Telephone Encounter (Signed)
Per Willette Cluster, NP,Call patient and let her know CT did not show any surprises. Did show ascites. Still waiting on some of the studies from the fluid.  See if patient has had pelvic ultrasound in the last year. Spoke with patient and gave her the results as per Willette Cluster, NP Patient states she had an ultrasound at her GYN back in January. This was done at [facility]. Called for the report. Patient is also reporting she is still draining from the paracentesis site.

## 2011-03-26 NOTE — Telephone Encounter (Signed)
Patient notified of results and Willette Cluster, NP recommendations. Patient will pick up diet information and rx's. She will weigh herself daily and call if weight increases. She will come for labs on 04/02/11

## 2011-04-03 ENCOUNTER — Ambulatory Visit (INDEPENDENT_AMBULATORY_CARE_PROVIDER_SITE_OTHER): Payer: BC Managed Care – PPO | Admitting: Gastroenterology

## 2011-04-03 ENCOUNTER — Ambulatory Visit: Payer: BC Managed Care – PPO

## 2011-04-03 ENCOUNTER — Encounter: Payer: Self-pay | Admitting: Gastroenterology

## 2011-04-03 DIAGNOSIS — R197 Diarrhea, unspecified: Secondary | ICD-10-CM

## 2011-04-03 DIAGNOSIS — R188 Other ascites: Secondary | ICD-10-CM | POA: Insufficient documentation

## 2011-04-03 LAB — IBC PANEL
Iron: 72 ug/dL (ref 42–145)
Saturation Ratios: 40.4 % (ref 20.0–50.0)
Transferrin: 127.2 mg/dL — ABNORMAL LOW (ref 212.0–360.0)

## 2011-04-03 LAB — FERRITIN: Ferritin: 451.2 ng/mL — ABNORMAL HIGH (ref 10.0–291.0)

## 2011-04-03 MED ORDER — FUROSEMIDE 20 MG PO TABS: 40.0000 mg | ORAL_TABLET | Freq: Every day | ORAL | Status: DC

## 2011-04-03 MED ORDER — SPIRONOLACTONE 50 MG PO TABS: 100.0000 mg | ORAL_TABLET | Freq: Two times a day (BID) | ORAL | Status: DC

## 2011-04-03 NOTE — Patient Instructions (Addendum)
#  1 Take Aldactone (spironolactone) 100 mg twice a day #2 take Lasix 40 mg daily #3 weigh yourself daily; call if you drop more than 2 pounds in one day You will go to the basement for labs today Your Endoscopy is scheduled on 04/04/2011 at 10am

## 2011-04-03 NOTE — Progress Notes (Signed)
History of Present Illness:  [name] has returned for followup of  ascites. Paracentesis revealed a transaminase. Cytology was negative. She was started on low-dose Lasix and Aldactone but has not had a significant diuresis. She complains of abdominal fullness and early satiety. There is no history of liver disease, drug use or blood transfusions.  Her C. difficile toxin was positive and she was treated with Flagyl. Diarrhea has subsided.    Review of Systems: Pertinent positive and negative review of systems were noted in the above HPI section. All other review of systems were otherwise negative.    Current Medications, Allergies, Past Medical History, Past Surgical History, Family History and Social History were reviewed in [employer] electronic medical record  Vital signs were reviewed in today's medical record. Physical Exam: General: Obese female no acute distress Head: Normocephalic and atraumatic Eyes:  sclerae anicteric, EOMI Ears: Normal auditory acuity Mouth: No deformity or lesions Lungs: Clear throughout to auscultation Heart: Regular rate and rhythm; no murmurs, rubs or bruits Abdomen: Her abdomen is distended from ascites. It is nontender. Rectal:deferred Musculoskeletal: Symmetrical with no gross deformities  Pulses:  Normal pulses noted Extremities: No clubbing, cyanosis; there is a 2+ edema in the left lower extremity  Neurological: Alert oriented x 4, grossly nonfocal Psychological:  Alert and cooperative. Normal mood and affect

## 2011-04-03 NOTE — Assessment & Plan Note (Signed)
This was due to pseudomembranous colitis and has subsided.

## 2011-04-03 NOTE — Assessment & Plan Note (Addendum)
She has new-onset ascites which I suspect is due to chronic liver disease, despite the absence of nodularity on CT. There is no evidence for ovarian pathology. She most likely has Barbara Douglas although other causes for chronic liver disease need to be ruled out.  Recommendations #1 check serologies for hepatitis A, B, and C #2 check iron, TIBC, ferritin, AMA, ANA alpha 1 antrypsin, cerruloplasmin levels #3 endoscopy to rule out esophageal varices #4 increase Aldactone to 100 mg twice a day and Lasix to 40 mg daily

## 2011-04-04 ENCOUNTER — Encounter: Payer: Self-pay | Admitting: Gastroenterology

## 2011-04-04 ENCOUNTER — Ambulatory Visit (AMBULATORY_SURGERY_CENTER): Payer: BC Managed Care – PPO | Admitting: Gastroenterology

## 2011-04-04 DIAGNOSIS — K7460 Unspecified cirrhosis of liver: Secondary | ICD-10-CM

## 2011-04-04 DIAGNOSIS — R188 Other ascites: Secondary | ICD-10-CM

## 2011-04-04 LAB — HEPATITIS B SURFACE ANTIGEN: Hepatitis B Surface Ag: NEGATIVE

## 2011-04-04 LAB — ALPHA-1-ANTITRYPSIN: A-1 Antitrypsin, Ser: 294 mg/dL — ABNORMAL HIGH (ref 90–200)

## 2011-04-04 LAB — CERULOPLASMIN: Ceruloplasmin: 26 mg/dL (ref 20–60)

## 2011-04-04 MED ORDER — SODIUM CHLORIDE 0.9 % IV SOLN: 500.0000 mL | INTRAVENOUS | Status: DC

## 2011-04-05 ENCOUNTER — Telehealth: Payer: Self-pay

## 2011-04-05 NOTE — Telephone Encounter (Signed)
Not available

## 2011-04-09 ENCOUNTER — Telehealth: Payer: Self-pay | Admitting: Gastroenterology

## 2011-04-09 NOTE — Telephone Encounter (Signed)
All tests were negative for other causes of underlying liver disease

## 2011-04-09 NOTE — Telephone Encounter (Signed)
Dr Arlyce Dice, Pt wants to know about lab results

## 2011-04-09 NOTE — Telephone Encounter (Signed)
Called pt to inform her of her labs, pt states that her left ankle/foot  is very swollen. Has not went down with the dieretics,Dr Arlyce Dice, Pt wants to know what she should do. She has lost 5 pounds since you started her dieretics.

## 2011-04-10 ENCOUNTER — Other Ambulatory Visit: Payer: Self-pay | Admitting: Gastroenterology

## 2011-04-10 NOTE — Telephone Encounter (Signed)
She needs a Doppler study of her left lower extremity  Continue current medications  Patient needs a followup appointment

## 2011-04-10 NOTE — Telephone Encounter (Signed)
Pt scheduled for doppler study of Left lower extremity 04/11/11@[facility] . Arrival time 8:45am, doppler at 9am. Pt scheduled to see Dr. Arlyce Dice 04/12/11@11am . Pt aware of appt dates and times.

## 2011-04-11 ENCOUNTER — Ambulatory Visit (HOSPITAL_COMMUNITY)
Admission: RE | Admit: 2011-04-11 | Discharge: 2011-04-11 | Disposition: A | Discharge status: Home / Self Care | Payer: BC Managed Care – PPO | Source: Ambulatory Visit | Attending: Gastroenterology | Admitting: Gastroenterology

## 2011-04-11 DIAGNOSIS — M79609 Pain in unspecified limb: Secondary | ICD-10-CM | POA: Insufficient documentation

## 2011-04-11 DIAGNOSIS — M7989 Other specified soft tissue disorders: Secondary | ICD-10-CM | POA: Insufficient documentation

## 2011-04-12 ENCOUNTER — Ambulatory Visit (INDEPENDENT_AMBULATORY_CARE_PROVIDER_SITE_OTHER): Payer: BC Managed Care – PPO | Admitting: Gastroenterology

## 2011-04-12 ENCOUNTER — Encounter: Payer: Self-pay | Admitting: Gastroenterology

## 2011-04-12 VITALS — BP 128/72 | HR 96 | Ht 65.0 in | Wt 311.0 lb

## 2011-04-12 DIAGNOSIS — K7460 Unspecified cirrhosis of liver: Secondary | ICD-10-CM | POA: Insufficient documentation

## 2011-04-12 NOTE — Patient Instructions (Signed)
You have an appointment to follow up with Dr Arlyce Dice on 9/24 at 10:30am You will need to go to the basement for labs at least 2 days before your appointment

## 2011-04-12 NOTE — Assessment & Plan Note (Addendum)
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 She has massive ascites but no evidence for varices.  Recommendations #1 continue Aldactone and Lasix #2 check electrolytes and alpha-fetoprotein prior to next visit in 2 weeks #3 vaccination for hepatitis A and B. at her next visit

## 2011-04-12 NOTE — Progress Notes (Signed)
History of Present Illness:  [name] has returned for followup of her ascites. Her serologies were negative for other causes of chronic liver disease. She has lost almost 14 pounds since her last visit. She continues to complain of dyspnea on exertion, abdominal discomfort and early satiety.  Upper endoscopy was negative for varices.    Review of Systems: Pertinent positive and negative review of systems were noted in the above HPI section. All other review of systems were otherwise negative.    Current Medications, Allergies, Past Medical History, Past Surgical History, Family History and Social History were reviewed in [employer] electronic medical record  Vital signs were reviewed in today's medical record. Physical Exam: General: She is a morbidly obese female  Head: Normocephalic and atraumatic Eyes:  sclerae anicteric, EOMI Ears: Normal auditory acuity Mouth: No deformity or lesions Lungs: Clear throughout to auscultation Heart: Regular rate and rhythm; no murmurs, rubs or bruits Abdomen: Soft, non tender but distended. No masses, hepatosplenomegaly or hernias noted. Normal Bowel sounds; she has gross ascites Rectal:deferred Musculoskeletal: Symmetrical with no gross deformities  Pulses:  Normal pulses noted Extremities: No clubbing, cyanosis,  or deformities noted; there is 1+ bilateral pedal edema Neurological: Alert oriented x 4, grossly nonfocal Psychological:  Alert and cooperative. Normal mood and affect Skin: He has a macular rash over her thighs bilaterally

## 2011-04-22 ENCOUNTER — Telehealth: Payer: Self-pay | Admitting: Gastroenterology

## 2011-04-22 NOTE — Telephone Encounter (Signed)
Pt had questions regarding her lasix rx and her aldactone rx, she wanted to make sure she was taking them correctly. Pt was told to increase her dose when she saw Dr. Arlyce Dice but the quantity on the rx was not for 30days worth. Pt has enough medication to make it until her next office visit. Pt informed she could get new rx at that time if he kept her at the same doses. Pt verbalized understanding.

## 2011-04-25 ENCOUNTER — Telehealth: Payer: Self-pay | Admitting: Gastroenterology

## 2011-04-25 ENCOUNTER — Other Ambulatory Visit (INDEPENDENT_AMBULATORY_CARE_PROVIDER_SITE_OTHER): Payer: BC Managed Care – PPO

## 2011-04-25 DIAGNOSIS — K7460 Unspecified cirrhosis of liver: Secondary | ICD-10-CM

## 2011-04-25 DIAGNOSIS — R188 Other ascites: Secondary | ICD-10-CM

## 2011-04-25 LAB — COMPREHENSIVE METABOLIC PANEL WITH GFR
ALT: 13 U/L (ref 0–35)
AST: 31 U/L (ref 0–37)
Albumin: 2.6 g/dL — ABNORMAL LOW (ref 3.5–5.2)
Alkaline Phosphatase: 92 U/L (ref 39–117)
BUN: 6 mg/dL (ref 6–23)
CO2: 27 meq/L (ref 19–32)
Calcium: 8 mg/dL — ABNORMAL LOW (ref 8.4–10.5)
Chloride: 100 meq/L (ref 96–112)
Creatinine, Ser: 0.7 mg/dL (ref 0.4–1.2)
GFR: 90.84 mL/min (ref >60.00)
Glucose, Bld: 97 mg/dL (ref 70–99)
Potassium: 3.3 meq/L — ABNORMAL LOW (ref 3.5–5.1)
Sodium: 139 meq/L (ref 135–145)
Total Bilirubin: 2.2 mg/dL — ABNORMAL HIGH (ref 0.3–1.2)
Total Protein: 6.8 g/dL (ref 6.0–8.3)

## 2011-04-25 LAB — CBC WITH DIFFERENTIAL/PLATELET
Basophils Relative: 0.5 % (ref 0.0–3.0)
Eosinophils Absolute: 0.2 10*3/uL (ref 0.0–0.7)
MCHC: 32.7 g/dL (ref 30.0–36.0)
MCV: 109 fl — ABNORMAL HIGH (ref 78.0–100.0)
Monocytes Absolute: 1.1 10*3/uL — ABNORMAL HIGH (ref 0.1–1.0)
Neutrophils Relative %: 64.6 % (ref 43.0–77.0)
Platelets: 211 10*3/uL (ref 150.0–400.0)
RBC: 3.96 Mil/uL (ref 3.87–5.11)
RDW: 16.2 % — ABNORMAL HIGH (ref 11.5–14.6)

## 2011-04-25 NOTE — Telephone Encounter (Signed)
Pt states she has been constipated for 4 days. Pt wants to know what she can take for constipation. Pt instructed to try Miralax OTC for her constipation. Pt verbalized understanding.

## 2011-04-29 ENCOUNTER — Encounter: Payer: Self-pay | Admitting: Gastroenterology

## 2011-04-29 ENCOUNTER — Other Ambulatory Visit: Payer: BC Managed Care – PPO

## 2011-04-29 ENCOUNTER — Ambulatory Visit (INDEPENDENT_AMBULATORY_CARE_PROVIDER_SITE_OTHER): Payer: BC Managed Care – PPO | Admitting: Gastroenterology

## 2011-04-29 DIAGNOSIS — R1907 Generalized intra-abdominal and pelvic swelling, mass and lump: Secondary | ICD-10-CM

## 2011-04-29 DIAGNOSIS — R140 Abdominal distension (gaseous): Secondary | ICD-10-CM

## 2011-04-29 DIAGNOSIS — K7460 Unspecified cirrhosis of liver: Secondary | ICD-10-CM

## 2011-04-29 DIAGNOSIS — R188 Other ascites: Secondary | ICD-10-CM

## 2011-04-29 DIAGNOSIS — R109 Unspecified abdominal pain: Secondary | ICD-10-CM

## 2011-04-29 MED ORDER — POTASSIUM CHLORIDE ER 10 MEQ PO TBCR: 20.0000 meq | EXTENDED_RELEASE_TABLET | ORAL | Status: DC

## 2011-04-29 NOTE — Assessment & Plan Note (Signed)
Probable [place]. Liver function stable.

## 2011-04-29 NOTE — Progress Notes (Signed)
History of Present Illness:  [name] has returned for followup of  cirrhosis and ascites. She has lost 16 pounds since her last visit. She still complains of early satiety and shortness of breath. Recent lab work including electrolytes was normal except for a potassium of 3.3.    Review of Systems: Pertinent positive and negative review of systems were noted in the above HPI section. All other review of systems were otherwise negative.    Current Medications, Allergies, Past Medical History, Past Surgical History, Family History and Social History were reviewed in [employer] electronic medical record  Vital signs were reviewed in today's medical record. Physical Exam: General: She is an obese female Abdomen is massively distended from ascites

## 2011-04-29 NOTE — Patient Instructions (Addendum)
You have been given a separate informational sheet regarding your tobacco use, the importance of quitting and local resources to help you quit. You need to make a follow up appointment in 4-5 weeks You will come back in 3 weeks to have labs drawn (Electrolytes)

## 2011-04-29 NOTE — Assessment & Plan Note (Signed)
She continues to diuresis on combination Lasix and Aldactone. Plan to continue with the same

## 2011-05-01 ENCOUNTER — Telehealth: Payer: Self-pay | Admitting: *Deleted

## 2011-05-01 NOTE — Telephone Encounter (Signed)
Message copied by Daphine Deutscher on Wed May 01, 2011  3:18 PM ------      Message from: Meredith Pel      Created: Wed May 01, 2011 11:07 AM       Rene Kocher, I didn't recently order any repeat stool studies on her. Can you find out where these results should go?  Patient had C-Diff a few weeks ago. Is she still have diarrhea?  If so,C-diff may need to be repeated. Thanks

## 2011-05-01 NOTE — Telephone Encounter (Signed)
Left a message for patient to call me. 

## 2011-05-01 NOTE — Telephone Encounter (Signed)
Spoke with patient and gave her results as per Willette Cluster, NP

## 2011-05-01 NOTE — Telephone Encounter (Signed)
Message copied by Daphine Deutscher on Wed May 01, 2011  9:16 AM ------      Message from: Meredith Pel      Created: Tue Apr 30, 2011  4:25 PM       Rene Kocher, please let her know CBC was okay. thanks

## 2011-05-02 NOTE — Telephone Encounter (Signed)
Left a message for patient to call me. 

## 2011-05-03 LAB — STOOL CULTURE

## 2011-05-06 NOTE — Telephone Encounter (Signed)
Spoke with patient and she states she could not fill the bottles for the stool samples at first. She brought them in when she got them filled. States she is alternating constipation with diarrhea.

## 2011-05-09 ENCOUNTER — Telehealth: Payer: Self-pay | Admitting: Gastroenterology

## 2011-05-09 NOTE — Telephone Encounter (Signed)
yes

## 2011-05-09 NOTE — Telephone Encounter (Signed)
Dr. Arlyce Dice Barbara Douglas states that she is very happy with the care she receives here and does not want to leave this practice. Pts mother is insisting that she have a second opinion. Barbara Douglas is asking about Dr. Elnoria Howard. Is this ok.......Marland KitchenPlease advise.

## 2011-05-09 NOTE — Telephone Encounter (Signed)
Pt aware. She knows to come and sign a release for her records.

## 2011-05-10 ENCOUNTER — Telehealth: Payer: Self-pay | Admitting: Gastroenterology

## 2011-05-10 NOTE — Telephone Encounter (Signed)
Medications called in by phone to the pharmacy with 3 refills

## 2011-05-24 ENCOUNTER — Telehealth: Payer: Self-pay | Admitting: Gastroenterology

## 2011-05-24 NOTE — Telephone Encounter (Signed)
Patient took her records to Dr. Elnoria Howard. He looked at her records and said he would refer her to [facility]. She did not see Dr. Elnoria Howard. Patient wants to go to [facility] for second opinion. She wants to know if she needs Dr. Arlyce Dice to refer her. Also, she is still having orange color urine and wants to know how long she will have this. Please, advise.

## 2011-05-27 ENCOUNTER — Other Ambulatory Visit: Payer: Self-pay | Admitting: Gastroenterology

## 2011-05-27 DIAGNOSIS — K7460 Unspecified cirrhosis of liver: Secondary | ICD-10-CM

## 2011-05-27 NOTE — Telephone Encounter (Signed)
She needs CMET and INR Schedule app't app't with Darrick Penna or his associate at [facility] hepatology clinic

## 2011-05-27 NOTE — Telephone Encounter (Signed)
Refer to GI division-any MD

## 2011-05-27 NOTE — Telephone Encounter (Signed)
Pt scheduled to see Dr. Merri Brunette at [facility] [phone]. Pt aware of appt date and time. Pt instructed to call the office for directions. Pts records faxed to [place] at [phone].

## 2011-05-27 NOTE — Telephone Encounter (Signed)
Dr. Arlyce Dice, the pt wants to be referred to [facility]. Which physician would you like for her to see...Marland KitchenMarland KitchenPlease advise.

## 2011-05-30 ENCOUNTER — Ambulatory Visit: Payer: BC Managed Care – PPO | Admitting: Gastroenterology

## 2011-06-05 ENCOUNTER — Other Ambulatory Visit: Payer: Self-pay | Admitting: Obstetrics and Gynecology

## 2011-06-05 DIAGNOSIS — N6325 Unspecified lump in the left breast, overlapping quadrants: Secondary | ICD-10-CM

## 2011-06-07 ENCOUNTER — Ambulatory Visit
Admission: RE | Admit: 2011-06-07 | Discharge: 2011-06-07 | Disposition: A | Discharge status: Home / Self Care | Payer: BC Managed Care – PPO | Source: Ambulatory Visit | Attending: Obstetrics and Gynecology | Admitting: Obstetrics and Gynecology

## 2011-06-07 DIAGNOSIS — N6325 Unspecified lump in the left breast, overlapping quadrants: Secondary | ICD-10-CM

## 2011-06-19 ENCOUNTER — Other Ambulatory Visit: Payer: BC Managed Care – PPO

## 2011-08-29 ENCOUNTER — Telehealth: Payer: Self-pay | Admitting: Gastroenterology

## 2011-08-29 NOTE — Telephone Encounter (Signed)
Pt states she has been seeing Dr. Merri Brunette at [facility]. Pt wanted to know if she had an emergency if she could still be seen by Dr. Arlyce Dice. Let the pt know that Dr. Arlyce Dice could still see her and that we have the notes from Dr. Merri Brunette in the system.

## 2011-08-30 ENCOUNTER — Emergency Department (HOSPITAL_COMMUNITY)
Admission: EM | Admit: 2011-08-30 | Discharge: 2011-08-30 | Disposition: A | Discharge status: Other Acute Inpatient Hospital | Payer: BC Managed Care – PPO | Attending: Emergency Medicine | Admitting: Emergency Medicine

## 2011-08-30 ENCOUNTER — Encounter (HOSPITAL_COMMUNITY): Payer: Self-pay | Admitting: *Deleted

## 2011-08-30 ENCOUNTER — Inpatient Hospital Stay (HOSPITAL_COMMUNITY)
Admission: RE | Admit: 2011-08-30 | Discharge: 2011-09-03 | DRG: 750 | Disposition: A | Discharge status: Home / Self Care | Payer: BC Managed Care – PPO | Attending: Psychiatry | Admitting: Psychiatry

## 2011-08-30 DIAGNOSIS — R197 Diarrhea, unspecified: Secondary | ICD-10-CM | POA: Insufficient documentation

## 2011-08-30 DIAGNOSIS — E669 Obesity, unspecified: Secondary | ICD-10-CM

## 2011-08-30 DIAGNOSIS — E871 Hypo-osmolality and hyponatremia: Secondary | ICD-10-CM

## 2011-08-30 DIAGNOSIS — Z560 Unemployment, unspecified: Secondary | ICD-10-CM

## 2011-08-30 DIAGNOSIS — K7030 Alcoholic cirrhosis of liver without ascites: Secondary | ICD-10-CM | POA: Insufficient documentation

## 2011-08-30 DIAGNOSIS — I10 Essential (primary) hypertension: Secondary | ICD-10-CM

## 2011-08-30 DIAGNOSIS — E876 Hypokalemia: Secondary | ICD-10-CM

## 2011-08-30 DIAGNOSIS — F17200 Nicotine dependence, unspecified, uncomplicated: Secondary | ICD-10-CM

## 2011-08-30 DIAGNOSIS — F19939 Other psychoactive substance use, unspecified with withdrawal, unspecified: Secondary | ICD-10-CM

## 2011-08-30 DIAGNOSIS — K589 Irritable bowel syndrome without diarrhea: Secondary | ICD-10-CM | POA: Insufficient documentation

## 2011-08-30 DIAGNOSIS — K3184 Gastroparesis: Secondary | ICD-10-CM

## 2011-08-30 DIAGNOSIS — J309 Allergic rhinitis, unspecified: Secondary | ICD-10-CM

## 2011-08-30 DIAGNOSIS — Z79899 Other long term (current) drug therapy: Secondary | ICD-10-CM | POA: Insufficient documentation

## 2011-08-30 DIAGNOSIS — F1010 Alcohol abuse, uncomplicated: Secondary | ICD-10-CM

## 2011-08-30 DIAGNOSIS — E785 Hyperlipidemia, unspecified: Secondary | ICD-10-CM

## 2011-08-30 DIAGNOSIS — G4700 Insomnia, unspecified: Secondary | ICD-10-CM

## 2011-08-30 DIAGNOSIS — F3289 Other specified depressive episodes: Secondary | ICD-10-CM

## 2011-08-30 DIAGNOSIS — K7460 Unspecified cirrhosis of liver: Secondary | ICD-10-CM

## 2011-08-30 DIAGNOSIS — F1320 Sedative, hypnotic or anxiolytic dependence, uncomplicated: Secondary | ICD-10-CM

## 2011-08-30 DIAGNOSIS — F10939 Alcohol use, unspecified with withdrawal, unspecified: Secondary | ICD-10-CM

## 2011-08-30 DIAGNOSIS — G4733 Obstructive sleep apnea (adult) (pediatric): Secondary | ICD-10-CM | POA: Insufficient documentation

## 2011-08-30 DIAGNOSIS — F1020 Alcohol dependence, uncomplicated: Secondary | ICD-10-CM | POA: Diagnosis present

## 2011-08-30 DIAGNOSIS — F10239 Alcohol dependence with withdrawal, unspecified: Secondary | ICD-10-CM

## 2011-08-30 DIAGNOSIS — F329 Major depressive disorder, single episode, unspecified: Secondary | ICD-10-CM

## 2011-08-30 DIAGNOSIS — F1220 Cannabis dependence, uncomplicated: Secondary | ICD-10-CM

## 2011-08-30 DIAGNOSIS — Z888 Allergy status to other drugs, medicaments and biological substances status: Secondary | ICD-10-CM

## 2011-08-30 HISTORY — DX: Unspecified cirrhosis of liver: K74.60

## 2011-08-30 LAB — CBC
HCT: 40.7 % (ref 36.0–46.0)
MCV: 92.1 fL (ref 78.0–100.0)
RBC: 4.42 MIL/uL (ref 3.87–5.11)
WBC: 13.6 10*3/uL — ABNORMAL HIGH (ref 4.0–10.5)

## 2011-08-30 LAB — COMPREHENSIVE METABOLIC PANEL
BUN: 5 mg/dL — ABNORMAL LOW (ref 6–23)
CO2: 23 mEq/L (ref 19–32)
Chloride: 94 mEq/L — ABNORMAL LOW (ref 96–112)
Creatinine, Ser: 0.56 mg/dL (ref 0.50–1.10)
GFR calc non Af Amer: >90 mL/min (ref >90)
Glucose, Bld: 202 mg/dL — ABNORMAL HIGH (ref 70–99)
Total Bilirubin: 1.2 mg/dL (ref 0.3–1.2)

## 2011-08-30 LAB — ETHANOL: Alcohol, Ethyl (B): 208 mg/dL — ABNORMAL HIGH (ref 0–11)

## 2011-08-30 LAB — ACETAMINOPHEN LEVEL: Acetaminophen (Tylenol), Serum: <15 ug/mL (ref 10–30)

## 2011-08-30 MED ORDER — POTASSIUM CHLORIDE CRYS ER 20 MEQ PO TBCR
20.0000 meq | EXTENDED_RELEASE_TABLET | Freq: Once | ORAL | Status: AC
  Administered 2011-08-30: 20 meq via ORAL
  Filled 2011-08-30: qty 1

## 2011-08-30 MED ORDER — NICOTINE 21 MG/24HR TD PT24: 21.0000 mg | MEDICATED_PATCH | Freq: Every day | TRANSDERMAL | Status: DC

## 2011-08-30 MED ORDER — LORAZEPAM 2 MG/ML IJ SOLN
2.0000 mg | Freq: Once | INTRAMUSCULAR | Status: AC
  Administered 2011-08-30: 2 mg via INTRAMUSCULAR
  Filled 2011-08-30: qty 1

## 2011-08-30 NOTE — ED Notes (Signed)
Pt transported to the [facility]

## 2011-08-30 NOTE — ED Notes (Signed)
Attempted to call [facility] x3 for BP update, no answer at this time, will send the pt with security.

## 2011-08-30 NOTE — ED Notes (Signed)
Called [facility] and spoke to [place], states that bed is available for the pt, however, AC needs to be notified and there after they will follow up for the pt to be transported to [facility]. Pt informed about this process.

## 2011-08-30 NOTE — ED Notes (Signed)
Security called to accompany pt for transportation to [facility]. Pt not wonded, pt in her close, RN notified/aware at [facility]. NT will be going as well.

## 2011-08-30 NOTE — ED Notes (Signed)
[facility] contacted, pt BP at this time elevated, addressing the issue prior the transportation to [facility] side. EDP [place] notified

## 2011-08-30 NOTE — ED Provider Notes (Signed)
History     CSN: [id number]  Arrival date & time 08/30/11  1700   First MD Initiated Contact with Patient 08/30/11 1946      Chief Complaint  Patient presents with  . Medical Clearance    pt states she went to [facility] for detox from ETOH but needs medical clearance. Pt reports last drink today at 12noon. reports usually drinks 1pint of bourbon daily. pt reports having diarrhea.     (Consider location/radiation/quality/duration/timing/severity/associated sxs/prior treatment) Patient is a 46 y.o. female presenting with intoxication. The history is provided by the patient (The patient states that she's been drinking a pint of liquor for over a month now she states that she wants to get some help. She drank some today.). No language interpreter was used.  Alcohol Intoxication This is a recurrent problem. The current episode started more than 1 week ago. The problem occurs daily. The problem has not changed since onset.Pertinent negatives include no chest pain, no abdominal pain and no headaches. The symptoms are aggravated by nothing. The symptoms are relieved by nothing. She has tried nothing for the symptoms. The treatment provided no relief.    Past Medical History  Diagnosis Date  . Obstructive sleep apnea   . Insomnia   . Allergic rhinitis   . Hypertension   . Gastroparesis   . Irritable bowel syndrome   . Depression   . Hyperlipidemia   . Sciatic nerve pain   . Obesity   . Cirrhosis of liver     Past Surgical History  Procedure Date  . Wisdom tooth extraction   . Uterine polyps     removed  12/2010    Family History  Problem Relation Age of Onset  . Clotting disorder Mother   . Lung cancer Maternal Aunt   . Liver cancer Maternal Uncle   . Heart disease      grandfather  . Colon cancer Neg Hx     History  Substance Use Topics  . Smoking status: Current Everyday Smoker -- 1.0 packs/day    Types: Cigarettes  . Smokeless tobacco: Never Used   Comment: pt advised it  is best to quit smoking for her health   . Alcohol Use: 42.0 oz/week    70 Shots of liquor per week    OB History    Grav Para Term Preterm Abortions TAB SAB Ect Mult Living                  Review of Systems  Constitutional: Negative for fatigue.  HENT: Negative for congestion, sinus pressure and ear discharge.   Eyes: Negative for discharge.  Respiratory: Negative for cough.   Cardiovascular: Negative for chest pain.  Gastrointestinal: Negative for abdominal pain and diarrhea.  Genitourinary: Negative for frequency and hematuria.  Musculoskeletal: Negative for back pain.  Skin: Negative for rash.  Neurological: Negative for seizures and headaches.  Hematological: Negative.   Psychiatric/Behavioral: Positive for dysphoric mood. Negative for hallucinations.    Allergies  Ultram  Home Medications   Current Outpatient Rx  Name Route Sig Dispense Refill  . CLONAZEPAM 1 MG PO TABS  TAKE ONE TABLET BY MOUTH 3 TO 4 TIMES DAILY FOR SLEEP IF NEEDED 120 tablet 5  . OMEGA-3 FATTY ACIDS 1000 MG PO CAPS Oral Take 1 g by mouth daily. As needed    . FLUTICASONE PROPIONATE (INHAL) 50 MCG/BLIST IN AEPB Inhalation Inhale 1 puff into the lungs 2 (two) times daily. 1-2 sprays in each nostril daily     .  FUROSEMIDE 20 MG PO TABS Oral Take 20 mg by mouth daily.    Marland Kitchen LISINOPRIL-HYDROCHLOROTHIAZIDE 10-12.5 MG PO TABS Oral Take 1 tablet by mouth daily.    Marland Kitchen ONE-DAILY MULTI VITAMINS PO TABS Oral Take 1 tablet by mouth daily.      Marland Kitchen SPIRONOLACTONE 50 MG PO TABS Oral Take 100 mg by mouth 2 (two) times daily.      BP 142/72  Pulse 110  Temp(Src) 98.2 F (36.8 C) (Oral)  Resp 18  Wt 234 lb (106.142 kg)  SpO2 99%  Physical Exam  Constitutional: She is oriented to person, place, and time. She appears well-developed.  HENT:  Head: Normocephalic and atraumatic.  Eyes: Conjunctivae and EOM are normal. No scleral icterus.  Neck: Neck supple. No thyromegaly present.  Cardiovascular: Normal rate  and regular rhythm.  Exam reveals no gallop and no friction rub.   No murmur heard. Pulmonary/Chest: No stridor. She has no wheezes. She has no rales. She exhibits no tenderness.  Abdominal: She exhibits no distension. There is no tenderness. There is no rebound.  Musculoskeletal: Normal range of motion. She exhibits no edema.  Lymphadenopathy:    She has no cervical adenopathy.  Neurological: She is oriented to person, place, and time. Coordination normal.  Skin: No rash noted. No erythema.  Psychiatric: She has a normal mood and affect. Her behavior is normal.    ED Course  Procedures (including critical care time)  Labs Reviewed  CBC - Abnormal; Notable for the following:    WBC 13.6 (*)    RDW 16.3 (*)    All other components within normal limits  COMPREHENSIVE METABOLIC PANEL - Abnormal; Notable for the following:    Sodium 134 (*)    Potassium 3.2 (*)    Chloride 94 (*)    Glucose, Bld 202 (*)    BUN 5 (*)    Total Protein 8.6 (*)    Albumin 3.4 (*)    AST 44 (*)    Alkaline Phosphatase 131 (*)    All other components within normal limits  ETHANOL - Abnormal; Notable for the following:    Alcohol, Ethyl (B) 208 (*)    All other components within normal limits  URINE RAPID DRUG SCREEN (HOSP PERFORMED) - Abnormal; Notable for the following:    Tetrahydrocannabinol POSITIVE (*)    All other components within normal limits  ACETAMINOPHEN LEVEL   No results found.   1. Alcohol abuse     Pt is transferred to [facility]  MDM          Benny Lennert, MD 08/30/11 2258

## 2011-08-31 ENCOUNTER — Encounter (HOSPITAL_COMMUNITY): Payer: Self-pay | Admitting: *Deleted

## 2011-08-31 DIAGNOSIS — F1010 Alcohol abuse, uncomplicated: Secondary | ICD-10-CM

## 2011-08-31 MED ORDER — ONE-DAILY MULTI VITAMINS PO TABS: 1.0000 | ORAL_TABLET | Freq: Every day | ORAL | Status: DC

## 2011-08-31 MED ORDER — CHLORDIAZEPOXIDE HCL 25 MG PO CAPS
25.0000 mg | ORAL_CAPSULE | Freq: Three times a day (TID) | ORAL | Status: AC
  Administered 2011-09-01 – 2011-09-02 (×3): 25 mg via ORAL
  Filled 2011-08-31 (×3): qty 1

## 2011-08-31 MED ORDER — NICOTINE 21 MG/24HR TD PT24
21.0000 mg | MEDICATED_PATCH | Freq: Every day | TRANSDERMAL | Status: DC
  Administered 2011-08-31 – 2011-09-03 (×5): 21 mg via TRANSDERMAL
  Filled 2011-08-31 (×6): qty 1

## 2011-08-31 MED ORDER — THIAMINE HCL 100 MG/ML IJ SOLN: 100.0000 mg | Freq: Once | INTRAMUSCULAR | Status: DC

## 2011-08-31 MED ORDER — INFLUENZA VIRUS VACC SPLIT PF IM SUSP
0.5000 mL | Freq: Once | INTRAMUSCULAR | Status: AC
  Administered 2011-08-31: 0.5 mL via INTRAMUSCULAR

## 2011-08-31 MED ORDER — CHLORDIAZEPOXIDE HCL 25 MG PO CAPS: 25.0000 mg | ORAL_CAPSULE | Freq: Every day | ORAL | Status: DC

## 2011-08-31 MED ORDER — ONDANSETRON 4 MG PO TBDP
4.0000 mg | ORAL_TABLET | Freq: Four times a day (QID) | ORAL | Status: AC | PRN
  Administered 2011-08-31: 4 mg via ORAL

## 2011-08-31 MED ORDER — LISINOPRIL 10 MG PO TABS
10.0000 mg | ORAL_TABLET | Freq: Every day | ORAL | Status: DC
  Administered 2011-08-31 – 2011-09-03 (×4): 10 mg via ORAL
  Filled 2011-08-31 (×4): qty 1

## 2011-08-31 MED ORDER — FLUTICASONE PROPIONATE (INHAL) 50 MCG/BLIST IN AEPB
1.0000 | INHALATION_SPRAY | Freq: Two times a day (BID) | RESPIRATORY_TRACT | Status: DC
  Filled 2011-08-31 (×7): qty 1

## 2011-08-31 MED ORDER — PNEUMOCOCCAL VAC POLYVALENT 25 MCG/0.5ML IJ INJ
0.5000 mL | INJECTION | Freq: Once | INTRAMUSCULAR | Status: AC
  Administered 2011-08-31: 0.5 mL via INTRAMUSCULAR

## 2011-08-31 MED ORDER — LOPERAMIDE HCL 2 MG PO CAPS: 2.0000 mg | ORAL_CAPSULE | ORAL | Status: AC | PRN

## 2011-08-31 MED ORDER — CHLORDIAZEPOXIDE HCL 25 MG PO CAPS
50.0000 mg | ORAL_CAPSULE | Freq: Once | ORAL | Status: AC
  Administered 2011-08-31: 50 mg via ORAL
  Filled 2011-08-31: qty 2

## 2011-08-31 MED ORDER — CHLORDIAZEPOXIDE HCL 25 MG PO CAPS
25.0000 mg | ORAL_CAPSULE | Freq: Four times a day (QID) | ORAL | Status: AC
  Administered 2011-08-31 – 2011-09-01 (×6): 25 mg via ORAL
  Filled 2011-08-31 (×6): qty 1

## 2011-08-31 MED ORDER — LISINOPRIL-HYDROCHLOROTHIAZIDE 10-12.5 MG PO TABS: 1.0000 | ORAL_TABLET | Freq: Every day | ORAL | Status: DC

## 2011-08-31 MED ORDER — CHLORDIAZEPOXIDE HCL 25 MG PO CAPS
25.0000 mg | ORAL_CAPSULE | ORAL | Status: AC
  Administered 2011-09-03 (×2): 25 mg via ORAL
  Filled 2011-08-31 (×2): qty 1

## 2011-08-31 MED ORDER — SPIRONOLACTONE 100 MG PO TABS: 100.0000 mg | ORAL_TABLET | Freq: Two times a day (BID) | ORAL | Status: DC

## 2011-08-31 MED ORDER — FUROSEMIDE 20 MG PO TABS
20.0000 mg | ORAL_TABLET | Freq: Every day | ORAL | Status: DC
  Administered 2011-08-31 – 2011-09-03 (×4): 20 mg via ORAL
  Filled 2011-08-31 (×4): qty 1

## 2011-08-31 MED ORDER — ADULT MULTIVITAMIN W/MINERALS CH
1.0000 | ORAL_TABLET | Freq: Every day | ORAL | Status: DC
  Administered 2011-08-31 – 2011-09-03 (×5): 1 via ORAL
  Filled 2011-08-31 (×4): qty 1

## 2011-08-31 MED ORDER — FUROSEMIDE 20 MG PO TABS: 20.0000 mg | ORAL_TABLET | Freq: Every day | ORAL | Status: DC

## 2011-08-31 MED ORDER — FLUTICASONE PROPIONATE HFA 44 MCG/ACT IN AERO
1.0000 | INHALATION_SPRAY | Freq: Two times a day (BID) | RESPIRATORY_TRACT | Status: DC
  Administered 2011-09-01 – 2011-09-03 (×2): 1 via RESPIRATORY_TRACT
  Filled 2011-08-31: qty 10.6

## 2011-08-31 MED ORDER — VITAMIN B-1 100 MG PO TABS
100.0000 mg | ORAL_TABLET | Freq: Every day | ORAL | Status: DC
  Administered 2011-09-01 – 2011-09-03 (×3): 100 mg via ORAL
  Filled 2011-08-31 (×4): qty 1

## 2011-08-31 MED ORDER — HYDROCHLOROTHIAZIDE 12.5 MG PO CAPS
12.5000 mg | ORAL_CAPSULE | Freq: Every day | ORAL | Status: DC
  Administered 2011-08-31 – 2011-09-03 (×4): 12.5 mg via ORAL
  Filled 2011-08-31 (×4): qty 1

## 2011-08-31 MED ORDER — HYDROXYZINE HCL 25 MG PO TABS
25.0000 mg | ORAL_TABLET | Freq: Four times a day (QID) | ORAL | Status: AC | PRN
  Administered 2011-08-31 – 2011-09-01 (×3): 25 mg via ORAL

## 2011-08-31 MED ORDER — CHLORDIAZEPOXIDE HCL 25 MG PO CAPS
25.0000 mg | ORAL_CAPSULE | Freq: Four times a day (QID) | ORAL | Status: AC | PRN
  Administered 2011-08-31 – 2011-09-01 (×4): 25 mg via ORAL
  Filled 2011-08-31 (×4): qty 1

## 2011-08-31 MED ORDER — SPIRONOLACTONE 100 MG PO TABS
100.0000 mg | ORAL_TABLET | Freq: Two times a day (BID) | ORAL | Status: DC
  Administered 2011-08-31 – 2011-09-03 (×7): 100 mg via ORAL
  Filled 2011-08-31 (×9): qty 1
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
## 2011-08-31 NOTE — Progress Notes (Signed)
[facility] Group Notes:  (Counselor/Nursing/MHT/Case Management/Adjunct)  08/31/2011 0800AM  Type of Therapy:  Discharge Planning  Summary of Progress/Problems:  Pt. attended and participated in aftercare planning group. Suicide prevention information was given. Pt stated that her anxiety level is high this morning due to no sleep. Pt stated that she would feel much better if she were well rested. Pt was drowsy however she was attentive in group. Pt shared that she has a Paramedic in [place] by the name of Darl Pikes L. And would like appointments with her.    Douglas, Barbara 08/31/2011, 9:31 AM

## 2011-08-31 NOTE — Progress Notes (Signed)
Patient ID: Barbara Douglas, female   DOB: September 14, 1965, 46 y.o.   MRN: [id number] 08-31-11 @ 1610 nursing shift note: pt has had no complaints today. She came to groups, participated and has been visible in the milieu. On her inventory sheet she wrote she slept poor, requested medication, appetite improving, energy low, attention poor with hopelessness at 7. Withdrawal symptoms are tremor, diarrhea, cravings and agitation. She denied any suicide ideation. Physical problems in the last 24 hours have been a headache. He pain goal today is 2. When she goes home she plans to eat better, join AA and continue therapy with her regular therapist. She sees no problems staying on her medications after discharge. RN will continue to monitor and safety checks continue every 15 minutes.

## 2011-08-31 NOTE — Progress Notes (Signed)
[facility] Group Notes:  (Counselor/Nursing/MHT/Case Management/Adjunct)  08/31/2011 1315PM  Type of Therapy:  Group Therapy  Participation Level:  Active  Participation Quality:  Appropriate, Attentive and Sharing  Affect:  Appropriate  Cognitive:  Appropriate  Insight:  Good  Engagement in Group:  Good  Engagement in Therapy:  Good  Modes of Intervention:  Clarification, Problem-solving, Socialization and Support  Summary of Progress/Problems: Pt. participated and attended group session on Self-Sabotage and Enabling behaviors. Pt. was asked to identify a self-sabotaging belief or behavior and explore ways to break-free from these beliefs and behaviors. Pt stated that she has recurring negative thoughts about herself. Pt stated that she does not feel good enough. Pt seemed to process her steps toward breaking free from her negative thoughts and was attentive during group.    Barbara Douglas 08/31/2011, 2:41 PM

## 2011-08-31 NOTE — Tx Team (Addendum)
Initial Interdisciplinary Treatment Plan  PATIENT STRENGTHS: (choose at least two) Ability for insight Active sense of humor Average or above average intelligence Capable of independent living Motivation for treatment/growth Supportive family/friends  PATIENT STRESSORS: Health problems Loss of Grandmother, dog, favorite uncle* Substance abuse Traumatic event   PROBLEM LIST: Problem List/Patient Goals Date to be addressed Date deferred Reason deferred Estimated date of resolution  ETOH abuse 08/31/11     Depression 08/31/11     HTN 09/03/11                                          DISCHARGE CRITERIA:  Improved stabilization in mood, thinking, and/or behavior Medical problems require only outpatient monitoring Motivation to continue treatment in a less acute level of care Withdrawal symptoms are absent or subacute and managed without 24-hour nursing intervention  PRELIMINARY DISCHARGE PLAN: Attend 12-step recovery group Return to previous living arrangement  PATIENT/FAMIILY INVOLVEMENT: This treatment plan has been presented to and reviewed with the patient, Barbara Douglas, and/or family member.  The patient and family have been given the opportunity to ask questions and make suggestions.  Juliann Pares 08/31/2011, 12:47 AM

## 2011-08-31 NOTE — Progress Notes (Signed)
Pt pleasant on approach, positive for group.  Pt's only complaint continues to be lack of sleep.  She was taking up to four 1 mg Klonopin (ordered), at home and is on the librium protocol here so unable to acquire this.  Pt would like the doctor to address something as a substitute to help her sleep.  Pt denies SI/HI/hallucinations at this time.  Support and encouragement offered, will continue to monitor.

## 2011-08-31 NOTE — Progress Notes (Addendum)
[facility] Adult Inpatient Family/Significant Other Suicide Prevention Education  Suicide Prevention Education:  Education Completed; Darl Pikes (partner) [phone],  (name of family member/significant other) has been identified by the patient as the family member/significant other with whom the patient will be residing, and identified as the person(s) who will aid the patient in the event of a mental health crisis (suicidal ideations/suicide attempt).  With written consent from the patient, the family member/significant other has been provided the following suicide prevention education, prior to the and/or following the discharge of the patient.  The suicide prevention education provided includes the following:  Suicide risk factors  Suicide prevention and interventions  National Suicide Hotline telephone number  [facility] assessment telephone number  [facility] Emergency Assistance 911  [facility] and/or Residential Mobile Crisis Unit telephone number  Request made of family/significant other to:  Remove weapons (e.g., guns, rifles, knives), all items previously/currently identified as safety concern.    Remove drugs/medications (over-the-counter, prescriptions, illicit drugs), all items previously/currently identified as a safety concern.  The family member/significant other verbalizes understanding of the suicide prevention education information provided.  The family member/significant other agrees to remove the items of safety concern listed above.  Pt. accepted information on suicide prevention, warning signs to look for with suicide and crisis line numbers to use. The pt. agreed to call crisis line numbers if having warning signs or having thoughts of suicide.   Darl Pikes suggested that the pt go to out pt therapy and perhaps a IOP for the pt needs something to stay busy and active.  [facility] 08/31/2011, 4:30 PM
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
## 2011-08-31 NOTE — Progress Notes (Signed)
Pt is a 46 year old Caucasion female admitted to the services of Dr. Suzanna Obey for detox from alcohol.  Pt has been drinking at least a pint of vodka daily as well as taking up to 4 mg of Klonopin nightly in hopes of inducing sleep.  Pt is obese and uses a CPAP at night for her sleep apnea.  Pt states that she has had many recent losses this past year including the loss of her job, the death of her dog of 14 years, the death of her favorite uncle, the death of her grandmother who raised her, and a sciatic nerve injury she sustained in a fall this past March.  Pt was also recently diagnosed with stage "three or four" cirrhosis of the liver.  Pt denies SI/HI/hallucinations but is very sad and depressed.  Her emergency contact is her POA and partner Barbara Douglas --[phone].

## 2011-08-31 NOTE — H&P (Signed)
Psychiatric Admission Assessment Adult  Patient Identification:  Barbara Douglas Date of Evaluation:  08/31/2011 45yo MWF History of Present Illness: Says this is her first formal detox. Larey Seat off the wagon right before Christmas. Says she had been sober almost a year and a half. Had a variety of stressors . Was diagnosed last August with cirrhosis. Says she began drinking socially age 22.  ETOH 208   Past Psychiatric History: Denies any prior psych.   Substance Abuse History:  Social History:    reports that she has been smoking Cigarettes.  She has been smoking about 1 pack per day. She has never used smokeless tobacco. She reports that she drinks about 42 ounces of alcohol per week. She reports that she does not use illicit drugs. Married lost her job of 8 years at [employer] when they closed. 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   Family Psych History: Had an uncle who stopped drinking and then lived another 20 years   Past Medical History:     Past Medical History  Diagnosis Date  . Obstructive sleep apnea   . Insomnia   . Allergic rhinitis   . Hypertension   . Gastroparesis   . Irritable bowel syndrome   . Depression   . Hyperlipidemia   . Sciatic nerve pain   . Obesity   . Cirrhosis of liver        Past Surgical History  Procedure Date  . Wisdom tooth extraction   . Uterine polyps     removed  12/2010    Allergies:  Allergies  Allergen Reactions  . Ultram (Tramadol Hcl) Other (See Comments)    Hallucinations, voices.within a few days of taking the medication    Current Medications:  Prior to Admission medications   Medication Sig Start Date End Date Taking? Authorizing Provider  clonazePAM (KLONOPIN) 1 MG tablet TAKE ONE TABLET BY MOUTH 3 TO 4 TIMES DAILY FOR SLEEP IF NEEDED 03/13/11   Waymon Budge, MD  fish oil-omega-3 fatty acids 1000 MG capsule Take 1 g by mouth  daily. As needed    Historical Provider, MD  fluticasone (FLOVENT DISKUS) 50 MCG/BLIST diskus inhaler Inhale 1 puff into the lungs 2 (two) times daily. 1-2 sprays in each nostril daily     Historical Provider, MD  furosemide (LASIX) 20 MG tablet Take 20 mg by mouth daily.    Historical Provider, MD  lisinopril-hydrochlorothiazide (PRINZIDE,ZESTORETIC) 10-12.5 MG per tablet Take 1 tablet by mouth daily.    Historical Provider, MD  Multiple Vitamin (MULTIVITAMIN) tablet Take 1 tablet by mouth daily.      Historical Provider, MD  spironolactone (ALDACTONE) 50 MG tablet Take 100 mg by mouth 2 (two) times daily. 04/03/11 04/02/12  Louis Meckel, MD    Mental Status Examination/Evaluation: Objective:  Appearance: Casual  Psychomotor Activity:  Decreased  Eye Contact::  Good  Speech:  Clear and Coherent  Volume:  Normal  Mood: better since she got some sleep    Affect:  Depressed  Thought Process: clear rational goal oriented    Orientation:  Full  Thought Content:  No AVH or delusions no DT's  Suicidal Thoughts:  No  Homicidal Thoughts:  No  Judgement:  Impaired  Insight:  Fair    DIAGNOSIS:    AXIS I Alcohol Abuse relapsed Christmas -has cirrhosis   AXIS II Deferred  AXIS III See medical history.  AXIS IV occupational problems  AXIS V 41-50 serious symptoms     Treatment Plan Summary:   admit for medicall y supported alcohol detox using the Low dose Librium protocol Assess for antidepressant   Continue treatment for ascites   Agree with H&P from ED  [facility] PA-C

## 2011-09-01 DIAGNOSIS — F1020 Alcohol dependence, uncomplicated: Principal | ICD-10-CM

## 2011-09-01 NOTE — Progress Notes (Signed)
[facility] MD Progress Note  09/01/2011 11:59 AM  Diagnosis:  Alcohol dependance and withdrawal  ADL's:  Intact  Sleep: Fair  Appetite:  Good  Suicidal Ideation:  None Homicidal Ideation:  None  AEB (as evidenced by): Patient has been feeling better with her detox protocol, has mild symptoms of hand tremors, but no nausea today.    Mental Status Examination/Evaluation: Objective:  Appearance: Casual and Fairly Groomed  Eye Contact::  Good  Speech:  Clear and Coherent  Volume:  Normal  Mood:  Anxious and Depressed  Affect:  Appropriate and Congruent  Thought Process:  Coherent and Goal Directed  Orientation:  Full  Thought Content:  WDL  Suicidal Thoughts:  No  Homicidal Thoughts:  No  Memory:  Recent;   Good  Judgement:  Fair  Insight:  Fair  Psychomotor Activity:  Normal  Concentration:  Fair  Recall:  Good  Akathisia:  No  Handed:  Right  AIMS (if indicated):     Assets:  Communication Skills Desire for Improvement Physical Health Social Support  Sleep:  Number of Hours: 6    Vital Signs:Blood pressure 126/82, pulse 103, temperature 97.9 F (36.6 C), temperature source Oral, resp. rate 24. Current Medications: Current Facility-Administered Medications  Medication Dose Route Frequency Provider Last Rate Last Dose  . chlordiazePOXIDE (LIBRIUM) capsule 25 mg  25 mg Oral Q6H PRN Nehemiah Settle, MD   25 mg at 08/31/11 2236  . chlordiazePOXIDE (LIBRIUM) capsule 25 mg  25 mg Oral QID Nehemiah Settle, MD   25 mg at 09/01/11 1137   Followed by  . chlordiazePOXIDE (LIBRIUM) capsule 25 mg  25 mg Oral TID Nehemiah Settle, MD       Followed by  . chlordiazePOXIDE (LIBRIUM) capsule 25 mg  25 mg Oral BH-qamhs Nehemiah Settle, MD       Followed by  . chlordiazePOXIDE (LIBRIUM) capsule 25 mg  25 mg Oral Daily Nehemiah Settle, MD      . fluticasone (FLOVENT HFA) 44 MCG/ACT inhaler 1 puff  1 puff Inhalation BID Verne Spurr, PA    1 puff at 09/01/11 0744  . furosemide (LASIX) tablet 20 mg  20 mg Oral Daily Nehemiah Settle, MD   20 mg at 09/01/11 0740  . lisinopril (PRINIVIL,ZESTRIL) tablet 10 mg  10 mg Oral Daily Nehemiah Settle, MD   10 mg at 09/01/11 0739   And  . hydrochlorothiazide (MICROZIDE) capsule 12.5 mg  12.5 mg Oral Daily Nehemiah Settle, MD   12.5 mg at 09/01/11 0739  . hydrOXYzine (ATARAX/VISTARIL) tablet 25 mg  25 mg Oral Q6H PRN Nehemiah Settle, MD   25 mg at 08/31/11 2236  . loperamide (IMODIUM) capsule 2-4 mg  2-4 mg Oral PRN Nehemiah Settle, MD      . mulitivitamin with minerals tablet 1 tablet  1 tablet Oral Daily Nehemiah Settle, MD   1 tablet at 09/01/11 0740  . nicotine (NICODERM CQ - dosed in mg/24 hours) patch 21 mg  21 mg Transdermal Q0600 Nehemiah Settle, MD   21 mg at 09/01/11 0607  . ondansetron (ZOFRAN-ODT) disintegrating tablet 4 mg  4 mg Oral Q6H PRN Nehemiah Settle, MD   4 mg at 08/31/11 1610  . spironolactone (ALDACTONE) tablet 100 mg  100 mg Oral BID Nehemiah Settle, MD   100 mg at 09/01/11 0740  . thiamine (B-1) injection 100 mg  100 mg Intramuscular Once [employer],  MD      . thiamine (VITAMIN B-1) tablet 100 mg  100 mg Oral Daily Nehemiah Settle, MD   100 mg at 09/01/11 0740  . DISCONTD: fluticasone (FLOVENT DISKUS) 50 MCG/BLIST diskus inhaler 1 puff  1 puff Inhalation BID Mickie D. Adams, PA      . DISCONTD: furosemide (LASIX) tablet 20 mg  20 mg Oral Daily Mickie D. Adams, PA      . DISCONTD: lisinopril-hydrochlorothiazide (PRINZIDE,ZESTORETIC) 10-12.5 MG per tablet 1 tablet  1 tablet Oral Daily Mickie D. Adams, PA      . DISCONTD: multivitamin tablet 1 tablet  1 tablet Oral Daily Mickie D. Adams, PA      . DISCONTD: spironolactone (ALDACTONE) tablet 100 mg  100 mg Oral BID Mickie D. Pernell Dupre, [place]        Lab Results:  Results for orders placed during the hospital encounter  of 08/30/11 (from the past 48 hour(s))  CBC     Status: Abnormal   Collection Time   08/30/11  5:45 PM      Component Value Range Comment   WBC 13.6 (*) 4.0 - 10.5 (K/uL)    RBC 4.42  3.87 - 5.11 (MIL/uL)    Hemoglobin 14.6  12.0 - 15.0 (g/dL)    HCT 16.1  09.6 - 04.5 (%)    MCV 92.1  78.0 - 100.0 (fL)    MCH 33.0  26.0 - 34.0 (pg)    MCHC 35.9  30.0 - 36.0 (g/dL)    RDW 40.9 (*) 81.1 - 15.5 (%)    Platelets 310  150 - 400 (K/uL)   COMPREHENSIVE METABOLIC PANEL     Status: Abnormal   Collection Time   08/30/11  5:45 PM      Component Value Range Comment   Sodium 134 (*) 135 - 145 (mEq/L)    Potassium 3.2 (*) 3.5 - 5.1 (mEq/L)    Chloride 94 (*) 96 - 112 (mEq/L)    CO2 23  19 - 32 (mEq/L)    Glucose, Bld 202 (*) 70 - 99 (mg/dL)    BUN 5 (*) 6 - 23 (mg/dL)    Creatinine, Ser 9.14  0.50 - 1.10 (mg/dL)    Calcium 9.2  8.4 - 10.5 (mg/dL)    Total Protein 8.6 (*) 6.0 - 8.3 (g/dL)    Albumin 3.4 (*) 3.5 - 5.2 (g/dL)    AST 44 (*) 0 - 37 (U/L)    ALT 17  0 - 35 (U/L)    Alkaline Phosphatase 131 (*) 39 - 117 (U/L)    Total Bilirubin 1.2  0.3 - 1.2 (mg/dL)    GFR calc non Af Amer >90  >90 (mL/min)    GFR calc Af Amer >90  >90 (mL/min)   ETHANOL     Status: Abnormal   Collection Time   08/30/11  5:45 PM      Component Value Range Comment   Alcohol, Ethyl (B) 208 (*) 0 - 11 (mg/dL)   ACETAMINOPHEN LEVEL     Status: Normal   Collection Time   08/30/11  5:45 PM      Component Value Range Comment   Acetaminophen (Tylenol), Serum <15.0  10 - 30 (ug/mL)   URINE RAPID DRUG SCREEN (HOSP PERFORMED)     Status: Abnormal   Collection Time   08/30/11  6:08 PM      Component Value Range Comment   Opiates NONE DETECTED  NONE DETECTED     Cocaine  NONE DETECTED  NONE DETECTED     Benzodiazepines NONE DETECTED  NONE DETECTED     Amphetamines NONE DETECTED  NONE DETECTED     Tetrahydrocannabinol POSITIVE (*) NONE DETECTED     Barbiturates NONE DETECTED  NONE DETECTED      Physical  Findings: AIMS:  , ,  ,  ,    CIWA:  CIWA-Ar Total: 7  COWS:     Treatment Plan Summary: Daily contact with patient to assess and evaluate symptoms and progress in treatment Medication management  Plan: Continue detox protocol and monitor for withdrawal symptoms    Sejal Cofield,JANARDHAHA R. 09/01/2011, 11:59 AM

## 2011-09-01 NOTE — Progress Notes (Signed)
Pt pleasant and appropriate, no complaints voiced.  Positive for group.  Denies SI/HI/hallucinations at this time.  Interacting appropriately and well on unit.  Support and encouragement offered, will continue to monitor.

## 2011-09-01 NOTE — Progress Notes (Signed)
Patient ID: Barbara Douglas, female   DOB: Jul 04, 1966, 46 y.o.   MRN: [id number]  [facility] Group Notes:  (Counselor/Nursing/MHT/Case Management/Adjunct)  09/01/2011 1:15 PM  Type of Therapy:  Group Therapy, Dance/Movement Therapy   Participation Level:  Active  Participation Quality:  Appropriate  Affect:  Appropriate  Cognitive:  Alert  Insight:  Good  Engagement in Group:  Good  Engagement in Therapy:  Good  Modes of Intervention:  Clarification, Problem-solving, Role-play, Socialization and Support  Summary of Progress/Problems: pt participated in a group discussion on healthy and unhealthy choices and how to use support for themselves and support from other to achieve sobriety. Pt identified a goal that they would like to achieve in the next 7 days and made a plan to begin working on this goal today. Pt. Identified a goal of taking one step at a time.     Rhunette Croft

## 2011-09-01 NOTE — Progress Notes (Signed)
Patient ID: Barbara Douglas, female   DOB: Aug 21, 1965, 46 y.o.   MRN: [id number] 09-01-11 @ 1238 nursing shift note: pt has voiced no complaints today.  She has taken med's, attended groups and been visible in the milieu. On her inventory sheet she wrote slept well, appetite good, energy normal, attention good. She denied any suicide ideation. In the last 24 hours she stated she had a slight headache. Her pain today has been 1 and her pain goal is 1. When she goes home she plans to eat healthier, use gratitude diary, 90 day AA meetings, continue with current counselor that she was seeing before she came here. She stated she saw no problems staying on medications after discharge. RN will continue to monitor and safety checks continue every 15 minutes.

## 2011-09-01 NOTE — Progress Notes (Signed)
Patient ID: Barbara Douglas, female   DOB: 1966/07/24, 46 y.o.   MRN: [id number] Pt. attended and participated in aftercare planning group. Pt. accepted information on suicide prevention, warning signs to look for with suicide and crisis line numbers to use. The pt. agreed to call crisis line numbers if having warning signs or having thoughts of suicide. Pt. listed their current feeling state as "better" and she is working on being positive today.

## 2011-09-01 NOTE — H&P (Signed)
Agree with H&P and treatment Plan  

## 2011-09-02 LAB — BASIC METABOLIC PANEL
BUN: 11 mg/dL (ref 6–23)
Chloride: 93 mEq/L — ABNORMAL LOW (ref 96–112)
Creatinine, Ser: 0.88 mg/dL (ref 0.50–1.10)
GFR calc Af Amer: 89 mL/min — ABNORMAL LOW (ref >90)
Glucose, Bld: 161 mg/dL — ABNORMAL HIGH (ref 70–99)

## 2011-09-02 LAB — CBC
HCT: 40.8 % (ref 36.0–46.0)
MCH: 32.2 pg (ref 26.0–34.0)
MCHC: 34.3 g/dL (ref 30.0–36.0)
MCV: 93.8 fL (ref 78.0–100.0)
RDW: 16.7 % — ABNORMAL HIGH (ref 11.5–15.5)

## 2011-09-02 MED ORDER — TRAZODONE HCL 50 MG PO TABS
50.0000 mg | ORAL_TABLET | Freq: Every day | ORAL | Status: DC
  Administered 2011-09-02: 50 mg via ORAL
  Filled 2011-09-02: qty 1

## 2011-09-02 NOTE — Progress Notes (Signed)
[facility] Group Notes:  (Counselor/Nursing/MHT/Case Management/Adjunct)  09/02/2011 1:03 PM  Type of Therapy:  Group Therapy at 11:00AM  Participation Level:  Active  Participation Quality:  Appropriate  Affect:  Appropriate  Cognitive:  Appropriate  Insight:  Good  Engagement in Group:  Good  Engagement in Therapy:  Good  Modes of Intervention:  Clarification, Problem-solving, Socialization and Support  Summary of Progress/Problems: pt shared that she would like to return to school but one obstacle to returning to school and also to recovery is physical pain.  Patient shared that relapse after one calendar year of sobriety occurred   Clide Dales 09/02/2011, 1:03 PM  [facility] Group Notes:  (Counselor/Nursing/MHT/Case Management/Adjunct)  09/02/2011 2:51 PM  Type of Therapy:  Group Therapy  Participation Level:  Active  Participation Quality:  Appropriate and Attentive  Affect:  Appropriate  Cognitive:  Appropriate  Insight:  Good  Engagement in Group:  Good  Engagement in Therapy:  Good  Modes of Intervention:  Education, Socialization and Support  Summary of Progress/Problems:  Solita was attentive to [facility] volunteer presentation including her story with substance abuse and dependence and her own mental health issues, along with information re [facility] programming.   Clide Dales 09/02/2011, 2:52 PM
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
## 2011-09-02 NOTE — Progress Notes (Signed)
Patient ID: Barbara Douglas, female   DOB: 07/28/1966, 46 y.o.   MRN: [id number] Patient is calm and cooperative on approach and is attending morning groups with appropriate interaction.  Patient reports she slept fair and appetite is good.  Patient denies any SI/HI or A/V hallucinations or complaints of pain. Encouraged patient to continue group therapy and express feelings.  Every 15 minute checks continued.  Patient reports she is motivated for treatment and remains safe on the unit.  Juel Burrow 09/02/2011 4:02 PM

## 2011-09-02 NOTE — Progress Notes (Signed)
[facility] MD Progress Note  09/02/2011 1:55 PM  S/O: Patient seen and evaluated. Chart reviewed. Patient stated that her mood was "good". Her affect was mood congruent and euthymic. She denied any current thoughts of self injurious behavior, suicidal ideation or homicidal ideation. She denied any significant depressive signs or symptoms at this time. There were no auditory or visual hallucinations, paranoia, delusional thought processes, or mania noted.  Thought process was linear and goal directed.  No psychomotor agitation or retardation was noted. Speech was normal rate, tone and volume. Eye contact was good. Judgment and insight are fair.  Patient has been up and engaged on the unit.  No acute safety concerns reported from team.  Sleep:  Number of Hours: 5.25    Vital Signs:Blood pressure 135/83, pulse 105, temperature 99.1 F (37.3 C), temperature source Oral, resp. rate 18.  Current Medications:   . chlordiazePOXIDE  25 mg Oral TID   Followed by  . chlordiazePOXIDE  25 mg Oral BH-qamhs   Followed by  . chlordiazePOXIDE  25 mg Oral Daily  . fluticasone  1 puff Inhalation BID  . furosemide  20 mg Oral Daily  . lisinopril  10 mg Oral Daily   And  . hydrochlorothiazide  12.5 mg Oral Daily  . mulitivitamin with minerals  1 tablet Oral Daily  . nicotine  21 mg Transdermal Q0600  . spironolactone  100 mg Oral BID  . thiamine  100 mg Intramuscular Once  . thiamine  100 mg Oral Daily    Lab Results: No results found for this or any previous visit (from the past 48 hour(s)).  Physical Findings: CIWA:  CIWA-Ar Total: 3   A/P: Alcohol Dependence & Withdrawal; Cannabis Dependence, recent one time relapse; Sleep Apnea; Insomnia; Hypokalemia; Elevated WBC; r/o Alcoholic Transaminitis   Continue librium taper. Repeat CBC, Chem7 and Liver panel pending. Exploring IOP/AA options. Trazodone for sleep. Potential discharge tomorrow per pt request.  Medication education completed.  Pros, cons,  risks, potential side effects and benefits (including no treatment) were discussed with pt.  Pt agreeable with the plan.  See orders.  Discussed with team.   Lupe Carney 09/02/2011, 1:55 PM

## 2011-09-02 NOTE — Discharge Planning (Signed)
New patient who is here for help with alcoholism.  Relapsed 4 days before Christmas and is now wanting help with stopping due to tremors and other withdrawal symptoms when trying it on her own.  Open to IOP referral.

## 2011-09-03 DIAGNOSIS — F1020 Alcohol dependence, uncomplicated: Secondary | ICD-10-CM | POA: Diagnosis present

## 2011-09-03 DIAGNOSIS — F1010 Alcohol abuse, uncomplicated: Secondary | ICD-10-CM

## 2011-09-03 LAB — HEPATITIS PANEL, ACUTE: HCV Ab: NEGATIVE

## 2011-09-03 MED ORDER — TRAZODONE HCL 50 MG PO TABS: ORAL_TABLET | ORAL | Status: DC
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
## 2011-09-03 NOTE — Progress Notes (Signed)
Peer review attempted with Dr. Alfonzo Beers.  Message left on voicemail at 255pm.  [phone]).  Discussed with team.

## 2011-09-03 NOTE — Progress Notes (Signed)
Recreation Therapy Notes  09/03/2011         Time: 1000      Group Topic/Focus: Patient invited to participate in animal assisted therapy. Pets as a coping skill and responsibility were discussed.   Participation Level: Active  Participation Quality: Appropriate and Attentive  Affect: Appropriate  Cognitive: Appropriate and Oriented   Additional Comments: Patient smiling, energetic, talking about her dog at home.  Barbara Douglas 09/03/2011 11:35 AM

## 2011-09-03 NOTE — Progress Notes (Signed)
Patient ID: Barbara Douglas, female   DOB: 1966/05/14, 46 y.o.   MRN: [id number] Pt is discharged ambulatory to ride home with her friend.  She denies SI/HI.  She verbalizes understanding of D/C meds and followup.  She plans to return to IOP.  She was given suicide prevention pamphlet.

## 2011-09-03 NOTE — Progress Notes (Signed)
[facility] Group Notes:  (Counselor/Nursing/MHT/Case Management/Adjunct)  09/03/2011 12:31 PM  Type of Therapy:  Group Therapy  Participation Level:  Active  Participation Quality:  Appropriate and Sharing  Affect:  Appropriate  Cognitive:  Appropriate  Insight:  Good  Engagement in Group:  Good  Engagement in Therapy:  Good  Modes of Intervention:  Clarification, Education, Socialization and Support  Summary of Progress/Problems: [name] willingly participated in discussion regarding feelings associated with diagnosis; she shared loneliness, isolation, preoccupation and despair.  Patient also shared her health complications and identified well with correlation of alcohol dependence as an allergy as compared to her father's reactions to bee stings. [name] shared excitement regarding the IOP program and requested print out of info re post acute withdrawal syndrome.    Clide Dales 09/03/2011, 12:39 PM

## 2011-09-03 NOTE — Discharge Summary (Signed)
Physician Discharge Summary Note  Patient:  Barbara Douglas is an 46 y.o., female MRN:  [id number] DOB:  1966/01/31 Patient phone:  [phone] (home)  Patient address:   [address] [place] [address],   Date of Admission:  08/30/2011 Date of Discharge: 09/03/2011  Discharge Diagnoses: Principal Problem:  *Alcohol abuse AXIS I:Alcohol Dependence; Alcohol and Benzodiazepine W/D; Cannabis Dependence, recent one time relapse; Sleep Apnea; Insomnia; Hypokalemia; Elevated WBC resolved; Cirrhosis, per Hx; Hyponatremia AXIS II:  deferred AXIS III:   Past Medical History  Diagnosis Date  . Obstructive sleep apnea   . Insomnia   . Allergic rhinitis   . Hypertension   . Gastroparesis   . Irritable bowel syndrome   . Depression   . Hyperlipidemia   . Sciatic nerve pain   . Obesity   . Cirrhosis of liver    AXIS IV:  Health related issues due to relapse AXIS V:  51-60 moderate symptoms  Level of Care:  OP  OZH:[id number] of Present Illness:  Says this is her first formal detox. Larey Seat off the wagon right before Christmas. Says she had been sober almost a year and a half. Had a variety of stressors . Was diagnosed last August with cirrhosis. Says she began drinking socially age 69. ETOH 208   Hospital Course:      The duration of Ilse"s stay at [facility] was unremarkable.      The patient was seen and evaluated by the Treatment team consisting of Psychiatrist, PAC, RN, Case Manager, and Therapist for evaluation and treatment plan with goal of stabilization upon discharge. The patient's physical and mental health problems were identified and treated appropriately.      Multiple modalities of treatment were used including medication, individual and group therapies, unit programming, AA/NA, improved nutrition, physical activity, and family sessions as needed.     The symptoms of alcohol/substance abuse withdrawal were monitored daily by serial clinical withdrawal scores. Improvement  was demonstrated by declining CIWA/COWS numbers, improving vital signs, increased cognition, and improvement in mood, sleep, appetite as well as a reduction in psychosocial symptoms.       The patient was evaluated and found to be stable enough for discharge and was released to home per the initial plan of treatment.   Mental Status Exam:  For mental status exam please see mental status exam and  suicide risk assessment completed by attending physician prior to discharge. Consults:  None Significant Diagnostic Studies:  Results for CARLYNE, KEEHAN (MRN [id number]) as of 09/03/2011 11:19  Ref. Range 09/02/2011 19:52  Sodium Latest Range: 135-145 mEq/L 130 (L)  Potassium Latest Range: 3.5-5.1 mEq/L 3.3 (L)  Chloride Latest Range: 96-112 mEq/L 93 (L)  CO2 Latest Range: 19-32 mEq/L 27  BUN Latest Range: 6-23 mg/dL 11  Creat Latest Range: 0.50-1.10 mg/dL 1.32  Calcium Latest Range: 8.4-10.5 mg/dL 9.7  GFR calc non Af Amer Latest Range: >90 mL/min 77 (L)  GFR calc Af Amer Latest Range: >90 mL/min 89 (L)  Glucose Latest Range: 70-99 mg/dL 440 (H)  WBC Latest Range: 4.0-10.5 K/uL 10.2  RBC Latest Range: 3.87-5.11 MIL/uL 4.35  HGB Latest Range: 12.0-15.0 g/dL 10.2  HCT Latest Range: 36.0-46.0 % 40.8  MCV Latest Range: 78.0-100.0 fL 93.8  MCH Latest Range: 26.0-34.0 pg 32.2  MCHC Latest Range: 30.0-36.0 g/dL 72.5  RDW Latest Range: 11.5-15.5 % 16.7 (H)  Platelets Latest Range: 150-400 K/uL 209    Discharge Vitals:   Blood pressure 103/71, pulse 106,  temperature 97.9 F (36.6 C), temperature source Oral, resp. rate 14.  Mental Status Exam: See Mental Status Examination and Suicide Risk Assessment completed by Attending Physician prior to discharge.  Discharge destination:  Home Is patient on multiple antipsychotic therapies at discharge:  No   Has Patient had three or more failed trials of antipsychotic monotherapy by history:  NO Recommended Plan for Multiple Antipsychotic Therapies:  NO Discharge Orders    Future Appointments: Provider: Department: Dept Phone: Center:   09/27/2011 2:00 PM Waymon Budge, MD [facility] [phone] None     Future Orders Please Complete By Expires   Diet - low sodium heart healthy      Increase activity slowly      Discharge instructions      Comments:   Please follow up with your PCP and your GI MD as soon as possible to follow up on your electrolytes.     Medication List  As of 09/03/2011 10:20 AM   START taking these medications         traZODone 50 MG tablet   Commonly known as: DESYREL   Take 2 tablets at bedtime for sleep.         CONTINUE taking these medications         fish oil-omega-3 fatty acids 1000 MG capsule      fluticasone 50 MCG/BLIST diskus inhaler   Commonly known as: FLOVENT DISKUS      furosemide 20 MG tablet   Commonly known as: LASIX      lisinopril-hydrochlorothiazide 10-12.5 MG per tablet   Commonly known as: PRINZIDE,ZESTORETIC      multivitamin tablet      spironolactone 50 MG tablet   Commonly known as: ALDACTONE         STOP taking these medications         clonazePAM 1 MG tablet          Where to get your medications    These are the prescriptions that you need to pick up.   You may get these medications from any pharmacy.         traZODone 50 MG tablet           Follow-up Information    Follow up with [facility] on 09/05/2011. (1:30 assessment with [name])    Contact information:   [name]  [name] [phone]  [phone]         Follow-up recommendations:  Keep all appointments as scheduled.  Comments:  Take all medications as directed.  Signed: Rona Ravens. Ramonia Mcclaran PAC For Dr. Sanda Klein 09/03/2011 11:32 AM

## 2011-09-03 NOTE — Progress Notes (Signed)
[facility] Case Management Discharge Plan:  Will you be returning to the same living situation after discharge: Yes,  with partner At discharge, do you have transportation home?:Yes,  see above Do you have the ability to pay for your medications:Yes,  insurance  Interagency Information:     Release of information consent forms completed and in the chart;  Patient's signature needed at discharge.  Patient to Follow up at:  Follow-up Information    Follow up with [facility] on 09/05/2011. (1:30 assessment with Barbara Douglas)    Contact information:   [address] Barbara Douglas  Barbara Douglas [phone]  [phone]         Patient denies SI/HI:   Yes,  yes    Safety Planning and Suicide Prevention discussed:  Yes,  yes  Barrier to discharge identified:No  Summary and Recommendations:   Barbara Douglas 09/03/2011, 9:46 AM

## 2011-09-03 NOTE — Treatment Plan (Signed)
Interdisciplinary Treatment Plan Update (Adult)  Date: 09/03/2011  Time Reviewed: 8:12 AM   Progress in Treatment: Attending groups: Yes Participating in groups: Yes Taking medication as prescribed: Yes Tolerating medication: Yes   Family/Significant othe contact made: Yes Counselor   Patient understands diagnosis:  Yes  As evidenced by asking for help with alcoholism Discussing patient identified problems/goals with staff:  Yes  See below Medical problems stabilized or resolved:  Yes Denies suicidal/homicidal ideation: Yes Issues/concerns per patient self-inventory:  None noted Other:  New problem(s) identified: N/A  Reason for Continuation of Hospitalization: Other; describe D/C today  Interventions implemented related to continuation of hospitalization:   Additional comments:  Estimated length of stay: D/C today  Discharge Plan:  Return home  Follow up [facility]   New goal(s): N/A  Review of initial/current patient goals per problem list:   1.  Goal(s):  Safely detox from alcohol  Met:  Yes  Target date:  As evidenced by:  2.  Goal (s):  Refer to follow up for substance abuse  Met:  Yes  Target date:  As evidenced by: appointment with IOP  3.  Goal(s):  Met:  Yes  Target date:  As evidenced by:  4.  Goal(s):  Met:  Yes  Target date:  As evidenced by:  Attendees: Patient:  Barbara Douglas 09/03/2011 8:12 AM  Family:     Physician:  Lupe Carney 09/03/2011 8:12 AM   Nursing: Neill Loft   09/03/2011 8:12 AM   Case Manager:  Richelle Ito, LCSW 09/03/2011 8:12 AM   Counselor:  Ronda Fairly, LCSWA 09/03/2011 8:12 AM   Other:  Shelda Jakes PA 09/03/2011 8:12 AM  Other:     Other:     Other:      Scribe for Treatment Team:   Ida Rogue, 09/03/2011 8:12 AM

## 2011-09-03 NOTE — Discharge Summary (Signed)
Pt seen and evaluated with physician extender and team. Completed Discharge Suicide Risk Assessment.  See orders.  Pt agreeable with plan.  Discussed with team.

## 2011-09-05 NOTE — Progress Notes (Signed)
Patient Discharge Instructions:  Admission Note Faxed,  09/05/2011 After Visit Summary Faxed,  09/05/2011 Faxed to the [facility] Care provider:  09/05/2011 D/C Summary Note faxed 09/05/2011 Facesheet faxed 09/05/2011   Faxed to [facility] @ [phone] And to Dr. Merri Brunette at [facility] @ [phone]  Wandra Scot, 09/05/2011, 11:32 AM

## 2011-09-13 ENCOUNTER — Telehealth: Payer: Self-pay | Admitting: Internal Medicine

## 2011-09-13 MED ORDER — CLONAZEPAM 1 MG PO TABS: ORAL_TABLET | ORAL | Status: DC

## 2011-09-13 NOTE — Telephone Encounter (Signed)
Pt is requesting a refill on her clonazepam. This was last refilled for her on 03/13/11 can take 1 tablet 3-4 times a day prn sleep #120 x 5 refills. Pt has OV scheduled for 09/27/11 at 2:00. Please advise if okay to refill Dr. Maple Hudson, thanks

## 2011-09-13 NOTE — Telephone Encounter (Signed)
Per CY-ok to refill. 

## 2011-09-13 NOTE — Telephone Encounter (Signed)
I have called RX into [facility] on [address]. I left detailed message on VM advising we have called rx in and fi she had any further questions to call us back

## 2011-09-26 ENCOUNTER — Encounter: Payer: Self-pay | Admitting: Internal Medicine

## 2011-09-27 ENCOUNTER — Ambulatory Visit (INDEPENDENT_AMBULATORY_CARE_PROVIDER_SITE_OTHER): Payer: BC Managed Care – PPO | Admitting: Internal Medicine

## 2011-09-27 ENCOUNTER — Encounter: Payer: Self-pay | Admitting: Internal Medicine

## 2011-09-27 VITALS — BP 112/60 | HR 89 | Ht 63.0 in | Wt 246.6 lb

## 2011-09-27 DIAGNOSIS — G4700 Insomnia, unspecified: Secondary | ICD-10-CM

## 2011-09-27 DIAGNOSIS — J309 Allergic rhinitis, unspecified: Secondary | ICD-10-CM

## 2011-09-27 DIAGNOSIS — F17200 Nicotine dependence, unspecified, uncomplicated: Secondary | ICD-10-CM

## 2011-09-27 DIAGNOSIS — G4730 Sleep apnea, unspecified: Secondary | ICD-10-CM

## 2011-09-27 DIAGNOSIS — Z720 Tobacco use: Secondary | ICD-10-CM

## 2011-09-27 MED ORDER — TRAZODONE HCL 50 MG PO TABS: ORAL_TABLET | ORAL | Status: DC

## 2011-09-27 NOTE — Progress Notes (Signed)
09/27/11- 17 yoF former smoker followed for OSA, allergic rhinitis, complicated  By ETOH cirrhosis, tobacco LOV- 09/21/10 Continues CPAP at 11 CWP with a nasal mask/Advanced. Pressure is comfortable. She is able to use it all night every night. Since last here has been working with hepatologist at [facility] for ETOH cirrhosis. He wanted her to reduce clonazepam to 1 tablet at bedtime. She rarely takes that now but wants to keep it available. She is taking trazodone 50 mg x3 tablets at bedtime and says this works well for her. She had gotten a single prescription from [facility] when she was therefore detox. Needs refill. History of allergic rhinitis and sinusitis especially in fall season. She they're ready for cats. Flonase has been sufficient. She still smokes one pack per day despite counseling. We talked again about smoking cessation support, nicotine replacement and alternatives today  ROS-see HPI Constitutional:   No-   weight loss, night sweats, fevers, chills, fatigue, lassitude. HEENT:   No-  headaches, difficulty swallowing, tooth/dental problems, sore throat,       No-  sneezing, itching, ear ache, nasal congestion, post nasal drip,  CV:  No-   chest pain, orthopnea, PND, swelling in lower extremities, anasarca, dizziness, palpitations Resp: No- acute  shortness of breath with exertion or at rest.              No-   productive cough,  Some non-productive cough,  No- coughing up of blood.              No-   change in color of mucus.  No- wheezing.   Skin: No-   rash or lesions. GI:  No-   heartburn, indigestion, abdominal pain, nausea, vomiting, diarrhea,                 change in bowel habits, loss of appetite GU:  MS:  No-   joint pain or swelling.  Marland Kitchen  No- back pain. Neuro-     nothing unusual Psych:  No- change in mood or affect. Some depression or anxiety.  No memory loss.  OBJ- Physical Exam General- Alert, Oriented, Affect-appropriate, Distress- none acute,  obese Skin- rash-none, lesions- none, excoriation- none Lymphadenopathy- none Head- atraumatic            Eyes- Gross vision intact, PERRLA, conjunctivae and secretions clear            Ears- Hearing, canals-normal            Nose- Clear, no-Septal dev, mucus, polyps, erosion, perforation             Throat- Mallampati II , mucosa clear , drainage- none, tonsils- atrophic Neck- flexible , trachea midline, no stridor , thyroid nl, carotid no bruit Chest - symmetrical excursion , unlabored           Heart/CV- RRR , no murmur , no gallop  , no rub, nl s1 s2                           - JVD- none , edema- none, stasis changes- none, varices- none           Lung- clear to P&A, wheeze- none, cough- none , dullness-none, rub- none           Chest wall-  Abd-  Br/ Gen/ Rectal- Not done, not indicated Extrem- cyanosis- none, clubbing, none, atrophy- none, strength- nl Neuro- grossly intact to observation

## 2011-09-27 NOTE — Patient Instructions (Addendum)
Use the flonase/ fluticasone 1-2 puffs in each nostril once daily at bedtime  Use your rescue albuterol inhaler 2 ouffs, up to 4 times daily if needed for chest tightness or wheezing  Try nicotine supplements as discussed to help you get offf cigarettes again  Call for clonazepam if needed  Script refilling trazodone sent

## 2011-10-01 ENCOUNTER — Encounter: Payer: Self-pay | Admitting: Internal Medicine

## 2011-10-01 DIAGNOSIS — Z720 Tobacco use: Secondary | ICD-10-CM | POA: Insufficient documentation

## 2011-10-01 NOTE — Assessment & Plan Note (Signed)
Counseling today included discussion of support including nicotine replacement.

## 2011-10-01 NOTE — Assessment & Plan Note (Signed)
Plan-continue using Flonase

## 2011-10-01 NOTE — Assessment & Plan Note (Signed)
Continues CPAP 11 CWP/Advanced. Good compliance and control. Weight loss would help a great deal and was counseled.

## 2011-10-01 NOTE — Assessment & Plan Note (Signed)
Has largely stopped using clonazepam. Trazodone is working well for management of her insomnia. We discussed liver clearance with this medicine.

## 2011-10-28 ENCOUNTER — Telehealth: Payer: Self-pay | Admitting: Internal Medicine

## 2011-10-28 NOTE — Telephone Encounter (Signed)
Dr Maple Hudson, if we are now giving 30 tablets can we change directions to say 1 tablet at bedtime? Current directions in her chart read take 1 tablet 3 to 4 times per day as needed for sleep. Please advise, thanks!

## 2011-10-28 NOTE — Telephone Encounter (Signed)
Pt requesting refill on Clonazepam.  Last saw CY on 09/27/11 and told to f/u in 1 year.  Last had rx filled 09/13/11 for # 120 x  Refills.  Please advise if ok to refill or not.  Thanks!

## 2011-10-28 NOTE — Telephone Encounter (Signed)
She has used up # 120 of these between Feb 8 and March 25. That is too much.  Give script this time for # 30. Tell her these must last 1 month.

## 2011-10-29 MED ORDER — CLONAZEPAM 1 MG PO TABS: ORAL_TABLET | ORAL | Status: DC

## 2011-10-29 NOTE — Telephone Encounter (Signed)
Pls advise on directions. See Leslie's note below.

## 2011-10-29 NOTE — Telephone Encounter (Signed)
Pt is aware of refill on clonazepam and to only take as directed at bedtime. 30 tabs must last 1 month.

## 2011-10-29 NOTE — Telephone Encounter (Signed)
Pt is checking on status of Rx & can be reached at [phone].  Pt would like to ensure that Rx is sent to [facility] on [address].  Barbara Douglas

## 2011-10-29 NOTE — Telephone Encounter (Signed)
Per CY- yes "1 tab at bedtime"

## 2011-11-28 ENCOUNTER — Telehealth: Payer: Self-pay | Admitting: Internal Medicine

## 2011-11-28 MED ORDER — CLONAZEPAM 1 MG PO TABS: ORAL_TABLET | ORAL | Status: DC

## 2011-11-28 NOTE — Telephone Encounter (Signed)
Per CY-okay to refill x 5  

## 2011-11-28 NOTE — Telephone Encounter (Signed)
Rx refill was called to pharm. LMOM for pt to be made aware.  

## 2011-11-28 NOTE — Telephone Encounter (Signed)
Please advise if ok to refill clonazepam 1 mg # 30 1 qhs prn. Last filled # 30 tabs on 10/30/11 with no refills. If ok please advise how many refills you want her to have, thanks!

## 2011-12-12 ENCOUNTER — Other Ambulatory Visit (HOSPITAL_COMMUNITY)
Admission: RE | Admit: 2011-12-12 | Discharge: 2011-12-12 | Disposition: A | Discharge status: Home / Self Care | Payer: BC Managed Care – PPO | Source: Ambulatory Visit | Attending: Obstetrics and Gynecology | Admitting: Obstetrics and Gynecology

## 2011-12-12 ENCOUNTER — Other Ambulatory Visit: Payer: Self-pay | Admitting: Obstetrics and Gynecology

## 2011-12-12 DIAGNOSIS — Z01419 Encounter for gynecological examination (general) (routine) without abnormal findings: Secondary | ICD-10-CM | POA: Insufficient documentation

## 2011-12-12 DIAGNOSIS — N760 Acute vaginitis: Secondary | ICD-10-CM | POA: Insufficient documentation

## 2011-12-12 DIAGNOSIS — Z113 Encounter for screening for infections with a predominantly sexual mode of transmission: Secondary | ICD-10-CM | POA: Insufficient documentation

## 2012-05-14 ENCOUNTER — Encounter: Payer: Self-pay | Admitting: Gastroenterology

## 2012-05-19 ENCOUNTER — Telehealth: Payer: Self-pay | Admitting: Internal Medicine

## 2012-05-19 MED ORDER — CLONAZEPAM 1 MG PO TABS: ORAL_TABLET | ORAL | Status: DC

## 2012-05-19 NOTE — Telephone Encounter (Signed)
Per CY ok to refill the clonzepam.  This has been called to the pharmacy and i called and spoke with pt and she is aware.

## 2012-05-19 NOTE — Telephone Encounter (Signed)
Requesting refill clonazepam 1 mg 1 at bedtime prn. Last refilled 11/28/11 #30 x 5 refills. Last OV 09/27/11 pending OV 10/02/12. Please advise thanks

## 2012-10-02 ENCOUNTER — Ambulatory Visit: Payer: Self-pay | Admitting: Internal Medicine

## 2012-11-09 ENCOUNTER — Ambulatory Visit (INDEPENDENT_AMBULATORY_CARE_PROVIDER_SITE_OTHER): Payer: BC Managed Care – PPO | Admitting: Internal Medicine

## 2012-11-09 ENCOUNTER — Encounter: Payer: Self-pay | Admitting: Internal Medicine

## 2012-11-09 ENCOUNTER — Ambulatory Visit (INDEPENDENT_AMBULATORY_CARE_PROVIDER_SITE_OTHER)
Admission: RE | Admit: 2012-11-09 | Discharge: 2012-11-09 | Disposition: A | Discharge status: Home / Self Care | Payer: BC Managed Care – PPO | Source: Ambulatory Visit | Attending: Internal Medicine | Admitting: Internal Medicine

## 2012-11-09 VITALS — BP 126/72 | HR 78 | Ht 64.0 in | Wt 325.0 lb

## 2012-11-09 DIAGNOSIS — Z720 Tobacco use: Secondary | ICD-10-CM

## 2012-11-09 DIAGNOSIS — G4700 Insomnia, unspecified: Secondary | ICD-10-CM

## 2012-11-09 DIAGNOSIS — G4733 Obstructive sleep apnea (adult) (pediatric): Secondary | ICD-10-CM

## 2012-11-09 DIAGNOSIS — F17200 Nicotine dependence, unspecified, uncomplicated: Secondary | ICD-10-CM

## 2012-11-09 DIAGNOSIS — F5104 Psychophysiologic insomnia: Secondary | ICD-10-CM

## 2012-11-09 DIAGNOSIS — J309 Allergic rhinitis, unspecified: Secondary | ICD-10-CM

## 2012-11-09 MED ORDER — CLONAZEPAM 1 MG PO TABS: ORAL_TABLET | ORAL | Status: DC

## 2012-11-09 MED ORDER — FLUTICASONE PROPIONATE 50 MCG/ACT NA SUSP: 2.0000 | Freq: Every day | NASAL | Status: DC

## 2012-11-09 NOTE — Progress Notes (Signed)
09/27/11- 49 yoF former smoker followed for OSA, allergic rhinitis, complicated  By ETOH cirrhosis, tobacco LOV- 09/21/10 Continues CPAP at 11 CWP with a nasal mask/Advanced. Pressure is comfortable. She is able to use it all night every night. Since last here has been working with hepatologist at [facility] for ETOH cirrhosis. He wanted her to reduce clonazepam to 1 tablet at bedtime. She rarely takes that now but wants to keep it available. She is taking trazodone 50 mg x3 tablets at bedtime and says this works well for her. She had gotten a single prescription from [facility] when she was therefore detox. Needs refill. History of allergic rhinitis and sinusitis especially in fall season. She they're ready for cats. Flonase has been sufficient. She still smokes one pack per day despite counseling. We talked again about smoking cessation support, nicotine replacement and alternatives today  11/09/12-  46 yoF former smoker followed for OSA, allergic rhinitis, complicated  By ETOH cirrhosis, tobacco FOLLOWS FOR: wears CPAP 11/ Advanced every night for about 6 hours and pressure working well for patient; Having a hard time breathing with allergies-feels dry in nasal area-blows nose every morning and has had blood in it past couple of weeks. Her psychiatrist increased her trazodone at bedtime to 300 mg. She continues clonazepam 1 mg head feels she needs both to sleep.still occasional night sweat she just can't fall asleep or stay asleep. She suspects this is her hormones.  Still smokes against advice. Notices little wheeze or cough. Fluticasone nasal spray for dry stuffy nose. We discussed this as a potential medication side effect  ROS-see HPI Constitutional:   No-   weight loss, night sweats, fevers, chills, fatigue, lassitude. HEENT:   No-  headaches, difficulty swallowing, tooth/dental problems, sore throat,       No-  sneezing, itching, ear ache, +nasal congestion, post nasal drip,  CV:   No-   chest pain, orthopnea, PND, swelling in lower extremities, anasarca, dizziness, palpitations Resp: No- acute  shortness of breath with exertion or at rest.              No-   productive cough,  Some non-productive cough,  No- coughing up of blood.              No-   change in color of mucus.  No- wheezing.   Skin: No-   rash or lesions. GI:  No-   heartburn, indigestion, abdominal pain, nausea, vomiting,  GU:  MS:  No-   joint pain or swelling.  .   Neuro-     nothing unusual Psych:  No- change in mood or affect. Some depression or anxiety.  No memory loss.  OBJ- Physical Exam General- Alert, Oriented, Affect-appropriate, Distress- none acute, +very obese Skin- rash-none, lesions- none, excoriation- none Lymphadenopathy- none Head- atraumatic            Eyes- Gross vision intact, PERRLA, conjunctivae and secretions clear            Ears- Hearing, canals-normal            Nose- +mild turbinate edema, no-Septal dev, +mucus bridging , polyps, erosion, perforation             Throat- Mallampati II , mucosa clear , drainage- none, tonsils- atrophic Neck- flexible , trachea midline, no stridor , thyroid nl, carotid no bruit Chest - symmetrical excursion , unlabored           Heart/CV- RRR , no murmur , no  gallop  , no rub, nl s1 s2                           - JVD- none , edema- none, stasis changes- none, varices- none           Lung- clear to P&A, wheeze- none, cough- none , dullness-none, rub- none           Chest wall-  Abd-  Br/ Gen/ Rectal- Not done, not indicated Extrem- cyanosis- none, clubbing, none, atrophy- none, strength- nl Neuro- grossly intact to observation

## 2012-11-09 NOTE — Patient Instructions (Addendum)
Refill scripts for clonazepam and fluticasone nasal spray  We can refill trazodone from here if needed before you establish for that care elsewhere  Try otc nasal saline gel for dry nose and nose bleeds.  Order- CXR   Dx Tobacco user  Please keep trying to stop smoking   We can continue CPAP 10/ Advanced  Please call as needed

## 2012-11-11 ENCOUNTER — Telehealth: Payer: Self-pay | Admitting: Internal Medicine

## 2012-11-11 NOTE — Telephone Encounter (Signed)
Notes Recorded by Waymon Budge, MD on 11/09/2012 at 8:13 PM Changes of COPD with chronic bronchitis. No active concerns. ----  I spoke with patient about results and she verbalized understanding and had no questions.

## 2012-11-15 NOTE — Assessment & Plan Note (Signed)
Plan-saline gel, refill fluticasone

## 2012-11-15 NOTE — Assessment & Plan Note (Addendum)
This likely relates to her psychiatric issues which are managed by Dr.Senna. We have provided her clonazepam and I told her we could refill her trazodone as part of her sleep management as long as this should not interfere with Dr Joanell Rising work.

## 2012-11-15 NOTE — Assessment & Plan Note (Signed)
Counseled again on smoking cessation with discussion of available support referrals. She is aware of the synergy between tobacco use and her obesity, and her sleep apnea.

## 2012-11-15 NOTE — Assessment & Plan Note (Addendum)
Good CPAP compliance and control. Weight loss is strongly advised with consideration of bariatric evaluation.

## 2013-01-06 ENCOUNTER — Telehealth: Payer: Self-pay | Admitting: Internal Medicine

## 2013-01-06 MED ORDER — TRAZODONE HCL 300 MG PO TABS: 300.0000 mg | ORAL_TABLET | Freq: Every day | ORAL | Status: DC

## 2013-01-06 NOTE — Telephone Encounter (Signed)
I spoke with pt and is aware RX has been sent. Nothing further was needed.  

## 2013-01-06 NOTE — Telephone Encounter (Signed)
Per CY-okay to refill now and once more but needs OV beyond that.

## 2013-01-06 NOTE — Telephone Encounter (Signed)
Trazadone last refilled 09/27/11 #90 x 5 refills Last OV 11/19/12 No pending  Please advise Dr. Maple Hudson thanks

## 2013-04-01 ENCOUNTER — Telehealth: Payer: Self-pay | Admitting: Internal Medicine

## 2013-04-01 MED ORDER — TRAZODONE HCL 300 MG PO TABS: 300.0000 mg | ORAL_TABLET | Freq: Every day | ORAL | Status: DC

## 2013-04-01 NOTE — Telephone Encounter (Signed)
Rx has been sent in per CY. Pt is aware.

## 2013-04-01 NOTE — Telephone Encounter (Signed)
Per CY okay to refill.  

## 2013-04-01 NOTE — Telephone Encounter (Signed)
Pt is requesting a refill of the trazodone 300 mg  1 po qhs---requesting that this be filled for a [facility] day [facility] [place].  CY please advise if ok to call in for the pt and for the 90 day supply.  Thanks  Allergies  Allergen Reactions  . Ultram [Tramadol Hcl] Other (See Comments)    Hallucinations, voices.within a few days of taking the medication    Last ov--11/09/2012 No pending future appts.

## 2013-04-13 ENCOUNTER — Telehealth: Payer: Self-pay | Admitting: Internal Medicine

## 2013-04-13 NOTE — Telephone Encounter (Signed)
Not a good idea to increase this. Note also her hepatologist had wanted dose lower, because of her liver disease.

## 2013-04-13 NOTE — Telephone Encounter (Signed)
Called, spoke with pt. She is requesting rx for clonazepam and would like this to be increased.  States she is now finding herself having to take 2 mg in order to go to sleep.  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  Dr. Maple Hudson, pls advise.  Thank you.  Clonazepam 1 mg rx last given 11/09/12 # 30 x 5. Last OV with CY:  11/09/12; asked to f/u in 1 yr No pending OV  [facility] [address]

## 2013-04-14 MED ORDER — CLONAZEPAM 1 MG PO TABS: ORAL_TABLET | ORAL | Status: DC

## 2013-04-14 NOTE — Telephone Encounter (Signed)
Spoke with pt and informed of Dr Sinclair Ship recommendations regarding Clonazepam.  Pt states that she sees her liver doctor in October and she will talk to him about increasing dose.  In the mean time , pt is requesting refill on Clonazepam 1 mg.  Please advise is ok to refill.

## 2013-04-14 NOTE — Telephone Encounter (Signed)
LMTCB x 1 

## 2013-04-14 NOTE — Telephone Encounter (Signed)
Ok to refill as before 

## 2013-04-14 NOTE — Telephone Encounter (Signed)
Rx has been called in per CY. Pt is aware. 

## 2013-04-27 ENCOUNTER — Telehealth: Payer: Self-pay | Admitting: Internal Medicine

## 2013-04-27 NOTE — Telephone Encounter (Signed)
Spoke with patient, patient inquiring about Klonopin Rx Informed patient that this was indeed sent in 04/14/13 to [facility] Patient really wanted this sent to [facility] Patient stated that this was ok she would call [facility] and [facility] Nothing further needed at this time from patient

## 2013-06-22 ENCOUNTER — Telehealth: Payer: Self-pay | Admitting: Internal Medicine

## 2013-06-22 MED ORDER — TRAZODONE HCL 300 MG PO TABS: 300.0000 mg | ORAL_TABLET | Freq: Every day | ORAL | Status: DC

## 2013-06-22 NOTE — Telephone Encounter (Signed)
Ok to refill trazodone

## 2013-06-22 NOTE — Telephone Encounter (Signed)
Refill sent and pt is aware. Mckaylah Bettendorf, CMA  

## 2013-06-22 NOTE — Telephone Encounter (Signed)
Last OV on 11-09-12, recommended f/u in 1 year, no appt set at this time. Per last OV instructions referring to trazodone: We can refill trazodone from here if needed before you establish for that care elsewhere.  Pt has called for refills on 01-06-13 #90 given but advised needs OV for more refills, no OV set Then on 04-01-13 pt called for refill and #90 again given, no OV set.   Pt calling today requesting a #90 day supply for trazodone 300mg . Please advise.Barbara Douglas, CMA Allergies  Allergen Reactions  . Ultram [Tramadol Hcl] Other (See Comments)    Hallucinations, voices.within a few days of taking the medication

## 2013-07-05 ENCOUNTER — Other Ambulatory Visit: Payer: Self-pay

## 2013-07-05 DIAGNOSIS — Z1231 Encounter for screening mammogram for malignant neoplasm of breast: Secondary | ICD-10-CM

## 2013-07-19 ENCOUNTER — Other Ambulatory Visit (HOSPITAL_COMMUNITY)
Admission: RE | Admit: 2013-07-19 | Discharge: 2013-07-19 | Disposition: A | Discharge status: Home / Self Care | Payer: BC Managed Care – PPO | Source: Ambulatory Visit | Attending: Obstetrics and Gynecology | Admitting: Obstetrics and Gynecology

## 2013-07-19 ENCOUNTER — Other Ambulatory Visit: Payer: Self-pay | Admitting: Obstetrics and Gynecology

## 2013-07-19 DIAGNOSIS — Z1151 Encounter for screening for human papillomavirus (HPV): Secondary | ICD-10-CM | POA: Insufficient documentation

## 2013-07-19 DIAGNOSIS — Z01419 Encounter for gynecological examination (general) (routine) without abnormal findings: Secondary | ICD-10-CM | POA: Insufficient documentation

## 2013-07-25 ENCOUNTER — Other Ambulatory Visit: Payer: Self-pay | Admitting: Internal Medicine

## 2013-07-26 NOTE — Telephone Encounter (Signed)
Please advise if okay to refill. Thanks.  

## 2013-07-26 NOTE — Telephone Encounter (Signed)
Ok to refill 

## 2013-07-27 NOTE — Telephone Encounter (Signed)
Called refill to pharmacy voicemail.  

## 2013-08-03 ENCOUNTER — Ambulatory Visit
Admission: RE | Admit: 2013-08-03 | Discharge: 2013-08-03 | Disposition: A | Discharge status: Home / Self Care | Payer: BC Managed Care – PPO | Source: Ambulatory Visit

## 2013-08-03 DIAGNOSIS — Z1231 Encounter for screening mammogram for malignant neoplasm of breast: Secondary | ICD-10-CM

## 2013-08-06 ENCOUNTER — Ambulatory Visit: Payer: Self-pay

## 2013-09-13 ENCOUNTER — Telehealth: Payer: Self-pay | Admitting: Internal Medicine

## 2013-09-13 MED ORDER — TRAZODONE HCL 300 MG PO TABS: 300.0000 mg | ORAL_TABLET | Freq: Every day | ORAL | Status: DC

## 2013-09-13 NOTE — Telephone Encounter (Signed)
Last OV 11/09/12 Reminder is set for appointment to be made in 11/2013 Last fill of Trazodone 07/27/13  CY - please advise on refill. Thanks.

## 2013-09-13 NOTE — Telephone Encounter (Signed)
Rx has been sent in. Pt is aware. 

## 2013-09-13 NOTE — Telephone Encounter (Signed)
Per CY-okay to refill Trazodone Rx as requested. Thanks.

## 2013-10-05 ENCOUNTER — Telehealth: Payer: Self-pay | Admitting: Internal Medicine

## 2013-10-05 NOTE — Telephone Encounter (Signed)
Left message for pt to call back x1. Last OV 11/09/13 No pending OV - is to follow up in 11/2013. ROV needs to be made.

## 2013-10-06 MED ORDER — CLONAZEPAM 1 MG PO TABS: ORAL_TABLET | ORAL | Status: DC

## 2013-10-06 MED ORDER — FLUTICASONE PROPIONATE (INHAL) 50 MCG/BLIST IN AEPB: 2.0000 | INHALATION_SPRAY | Freq: Two times a day (BID) | RESPIRATORY_TRACT | Status: DC | PRN

## 2013-10-06 MED ORDER — FLUTICASONE PROPIONATE 50 MCG/ACT NA SUSP: 2.0000 | Freq: Every day | NASAL | Status: DC

## 2013-10-06 NOTE — Telephone Encounter (Signed)
Clonazepam telephoned to [facility] to pharmacist Gunnar FusiPaula Left another detailed message informing her that [facility] okayed the refill with 2 additional to last until her April appt but that she still needs to call the office to schedule this

## 2013-10-06 NOTE — Telephone Encounter (Signed)
Ok to refill clonazepam for 2 months, pending ROV in that time.

## 2013-10-06 NOTE — Telephone Encounter (Signed)
Last ov 4.7.14  Left detailed message on the number provided below as okayed by patient informing her that we will be happy to refill the Flonase and Flovent, but will need to obtain authorization from CD[facility] to refill the Clonazepam.  Also informed pt that her last appt with CDY was 4.7.14 and he wanted her to follow up in 1 year but there are no pending appts >> we will need her to call the office to schedule this appt.    Flonase and Flovent ref[facility] x[facility] Dr Maple HudsonYoung please advise if okay to refill pt's Clonazepam.  Thanks.

## 2013-10-06 NOTE — Telephone Encounter (Signed)
Pt calling back again [phone] ok to leave a voicemail

## 2013-10-08 NOTE — Telephone Encounter (Signed)
ROV has been scheduled for 11/25/13 at 9:15am. She is aware that her medications have been sent in. Nothing further was needed.

## 2013-10-08 NOTE — Telephone Encounter (Signed)
Pt returning call

## 2013-10-08 NOTE — Telephone Encounter (Signed)
LMTCB on only # in chart, pt needs to schedule an appt. Carron CurieJennifer Castillo, CMA

## 2013-11-25 ENCOUNTER — Encounter (INDEPENDENT_AMBULATORY_CARE_PROVIDER_SITE_OTHER): Payer: Self-pay

## 2013-11-25 ENCOUNTER — Ambulatory Visit (INDEPENDENT_AMBULATORY_CARE_PROVIDER_SITE_OTHER): Payer: BC Managed Care – PPO | Admitting: Internal Medicine

## 2013-11-25 ENCOUNTER — Encounter: Payer: Self-pay | Admitting: Internal Medicine

## 2013-11-25 VITALS — BP 112/66 | HR 86 | Ht 64.0 in | Wt 315.0 lb

## 2013-11-25 DIAGNOSIS — F5104 Psychophysiologic insomnia: Secondary | ICD-10-CM

## 2013-11-25 DIAGNOSIS — G4700 Insomnia, unspecified: Secondary | ICD-10-CM

## 2013-11-25 DIAGNOSIS — Z720 Tobacco use: Secondary | ICD-10-CM

## 2013-11-25 DIAGNOSIS — J42 Unspecified chronic bronchitis: Secondary | ICD-10-CM

## 2013-11-25 DIAGNOSIS — J309 Allergic rhinitis, unspecified: Secondary | ICD-10-CM

## 2013-11-25 DIAGNOSIS — F17200 Nicotine dependence, unspecified, uncomplicated: Secondary | ICD-10-CM

## 2013-11-25 DIAGNOSIS — G4733 Obstructive sleep apnea (adult) (pediatric): Secondary | ICD-10-CM

## 2013-11-25 MED ORDER — TRAZODONE HCL 300 MG PO TABS: 300.0000 mg | ORAL_TABLET | Freq: Every day | ORAL | Status: DC

## 2013-11-25 MED ORDER — CLONAZEPAM 1 MG PO TABS
ORAL_TABLET | ORAL | Status: DC
Start: 2013-11-25 — End: 2016-06-07

## 2013-11-25 NOTE — Progress Notes (Signed)
09/27/11- [age] yoF former smoker followed for OSA, allergic rhinitis, complicated  By ETOH cirrhosis, tobacco LOV- 09/21/10 Continues CPAP at 11 CWP with a nasal mask/Advanced. Pressure is comfortable. She is able to use it all night every night. Since last here has been [facility]. He wanted her to reduce clonazepam to 1 tablet at bedtime. She rarely takes that now but wants to keep it available. She is taking trazodone 50 mg x3 tablets at bedtime and says this works well for her. [facility] therefore [facility]. Needs refill. History of allergic rhinitis and sinusitis especially in fall season. She they're ready for cats. Flonase has been sufficient. She still smokes one pack per day despite counseling. We talked again about smoking cessation support, nicotine replacement and alternatives today  11/09/12-  46 yoF  smoker followed for OSA, allergic rhinitis, complicated  By ETOH cirrhosis, tobacco FOLLOWS FOR: wears CPAP 11/ Advanced every night for about 6 hours and pressure working well for patient; Having a hard time breathing with allergies-feels dry in nasal area-blows nose every morning and has had blood in it past couple of weeks. Her psychiatrist increased her trazodone at bedtime to 300 mg. She continues clonazepam 1 mg head feels she needs both to sleep.still occasional night sweat she just can't fall asleep or stay asleep. She suspects this is her hormones.  Still smokes against advice. Notices little wheeze or cough. Fluticasone nasal spray for dry stuffy nose. We discussed this as a potential medication side effect  11/25/13- 47 yoF former smoker followed for OSA, Chronic Bronchitis, allergic rhinitis,, complicated  By ETOH cirrhosis,  FOLLOWS FOR: Wears CPAP 11/Advanced every night for about 6-8 hours; pressure works well for patient; not due for supplies at this time. Quit smoking  last year after we called report of CXR showing COPD/ chronic bronchitis changes. CXR 11/11/12 IMPRESSION:  COPD/chronic bronchitis, without acute superimposed process.  Original Report Authenticated By: Jeronimo GreavesKyle Talbot, M.D.  ROS-see HPI Constitutional:   No-   weight loss, night sweats, fevers, chills, fatigue, lassitude. HEENT:   No-  headaches, difficulty swallowing, tooth/dental problems, sore throat,       No-  sneezing, itching, ear ache, +nasal congestion, post nasal drip,  CV:  No-   chest pain, orthopnea, PND, swelling in lower extremities, anasarca, dizziness, palpitations Resp: No- acute  shortness of breath with exertion or at rest.              No-   productive cough,  Some non-productive cough,  No- coughing up of blood.              No-   change in color of mucus.  No- wheezing.   Skin: No-   rash or lesions. GI:  No-   heartburn, indigestion, abdominal pain, nausea, vomiting,  GU:  MS:  No-   joint pain or swelling.  .   Neuro-     nothing unusual Psych:  No- change in mood or affect. Some depression or anxiety.  No memory loss.  OBJ- Physical Exam General- Alert, Oriented, Affect-appropriate, Distress- none acute, +very obese Skin- rash-none, lesions- none, excoriation- none Lymphadenopathy- none Head- atraumatic            Eyes- Gross vision intact, PERRLA, conjunctivae and secretions clear            Ears- normal  Nose- +mild turbinate edema, no-Septal dev, +mucus bridging , polyps, erosion, perforation             Throat- Mallampati II , mucosa clear , drainage- none, tonsils- atrophic Neck- flexible , trachea midline, no stridor , thyroid nl, carotid no bruit Chest - symmetrical excursion , unlabored           Heart/CV- RRR , no murmur , no gallop  , no rub, nl s1 s2                           - JVD- none , edema- none, stasis changes- none, varices- none           Lung- clear to P&A, wheeze- none, cough- none , dullness-none, rub- none           Chest  wall-  Abd-  Br/ Gen/ Rectal- Not done, not indicated Extrem- cyanosis- none, clubbing, none, atrophy- none, strength- nl Neuro- grossly intact to observation

## 2013-11-25 NOTE — Patient Instructions (Addendum)
We can continue CPAP 11/ Advanced  Scripts printed for clonazepam and trazodone  Try otc Nasacort steroid nasal spray for allergy instead of flonase      1-2 puffs each nostril once daily at bedtime  Use your Flovent/ fluticasone steroid inhaler      1 inhalation, then rinse mouth, twice daily  Order- schedule PFT     Dx chronic bronchitis

## 2013-12-13 ENCOUNTER — Encounter: Payer: Self-pay | Admitting: Podiatry

## 2013-12-13 ENCOUNTER — Ambulatory Visit (INDEPENDENT_AMBULATORY_CARE_PROVIDER_SITE_OTHER): Payer: BC Managed Care – PPO | Admitting: Podiatry

## 2013-12-13 VITALS — BP 161/84 | HR 75 | Resp 16

## 2013-12-13 DIAGNOSIS — L600 Ingrowing nail: Secondary | ICD-10-CM

## 2013-12-13 NOTE — Patient Instructions (Signed)

## 2013-12-14 ENCOUNTER — Telehealth: Payer: Self-pay | Admitting: *Deleted

## 2013-12-14 NOTE — Progress Notes (Signed)
Subjective:     Patient ID: Barbara Douglas, female   DOB: 12/25/1965, 48 y.o.   MRN: [id number]  HPI patient presents stating I'm having pain in my ingrown toenails of both feet. States that she's tried to trim them and soak without relief   Review of Systems     Objective:   Physical Exam Neurovascular status intact with digital well perfused and ingrown big toenails of both feet medial border that are painful when pressed    Assessment:     Ingrown toenails chronic nature medial border of both feet    Plan:     H&P performed and conditions discussed. I discussed removal of the quarters and explained the risk of this procedure and patient wants to have this done understanding the risk of this procedure. Today I infiltrated 60 mg Xylocaine Marcaine mixture remove the medial borders of each big toe exposed matrix and applied phenol 3 applications followed by alcohol lavaged and sterile dressing. Instructed on soaks and reappoint

## 2013-12-14 NOTE — Telephone Encounter (Signed)
Saw Dr. Charlsie Merlesegal yesterday for 2 ingrown toenails.  Soaked this morning, toes started bleeding.  I redressed them.  Blood soaked through left one.  Is this something I should be concerned about?  I also want to make sure I'm not soaking too much.  I returned her call.  I informed her the bleeding is normal.  I advised her to put a little compression on it when she puts her dressing on but not enough where it cuts circulation off.  I advised her to soak twice a day.  She asked how long it would bleed.  I informed her it varies, could bleed up to 2-3 weeks.  She asked how long does she need to do the soaks.  I told her as long as there is drainage, continue to do the soaks.

## 2013-12-24 NOTE — Assessment & Plan Note (Addendum)
remaining off cigarettes with encouragement Plan-PFT

## 2013-12-24 NOTE — Assessment & Plan Note (Signed)
Plan-sleep counseling done again. Okay to refill meds

## 2013-12-24 NOTE — Assessment & Plan Note (Signed)
Good compliance and control 

## 2013-12-24 NOTE — Assessment & Plan Note (Signed)
Plan-okay to continue Flonase and nasal saline

## 2014-01-21 ENCOUNTER — Ambulatory Visit: Payer: Self-pay | Admitting: Internal Medicine

## 2014-01-31 ENCOUNTER — Ambulatory Visit (INDEPENDENT_AMBULATORY_CARE_PROVIDER_SITE_OTHER): Payer: BC Managed Care – PPO | Admitting: Podiatry

## 2014-01-31 ENCOUNTER — Encounter: Payer: Self-pay | Admitting: Podiatry

## 2014-01-31 VITALS — BP 146/77 | HR 78 | Resp 16

## 2014-01-31 DIAGNOSIS — L03039 Cellulitis of unspecified toe: Secondary | ICD-10-CM

## 2014-01-31 NOTE — Progress Notes (Signed)
Subjective:     Patient ID: Barbara Douglas, female   DOB: 09/01/1965, 48 y.o.   MRN: [id number]  HPI patient presents that she was concerned because she's having some pain in her nails site left that we get fixed several months ago   Review of Systems     Objective:   Physical Exam Neurovascular status intact with pain and crusted tissue on the medial side left hallux but no proximal erythema edema or drainage    Assessment:     Possible localized irritation of the nailbed with no indication of proximal infection    Plan:     Explained this to patient and advised on soaks padding and if symptoms continue to persist we may need to consider antibiotic treatment or if redness or swelling begins

## 2014-08-08 ENCOUNTER — Other Ambulatory Visit (HOSPITAL_COMMUNITY)
Admission: RE | Admit: 2014-08-08 | Discharge: 2014-08-08 | Disposition: A | Discharge status: Home / Self Care | Payer: Self-pay | Source: Ambulatory Visit | Attending: Obstetrics and Gynecology | Admitting: Obstetrics and Gynecology

## 2014-08-08 ENCOUNTER — Other Ambulatory Visit: Payer: Self-pay | Admitting: Obstetrics and Gynecology

## 2014-08-08 DIAGNOSIS — Z01419 Encounter for gynecological examination (general) (routine) without abnormal findings: Secondary | ICD-10-CM | POA: Insufficient documentation

## 2014-08-10 LAB — CYTOLOGY - PAP

## 2014-10-13 ENCOUNTER — Encounter: Payer: Self-pay | Admitting: Podiatry

## 2014-10-13 ENCOUNTER — Ambulatory Visit (INDEPENDENT_AMBULATORY_CARE_PROVIDER_SITE_OTHER): Payer: 59 | Admitting: Podiatry

## 2014-10-13 VITALS — BP 161/84 | HR 81 | Resp 16

## 2014-10-13 DIAGNOSIS — L600 Ingrowing nail: Secondary | ICD-10-CM | POA: Diagnosis not present

## 2014-10-13 NOTE — Patient Instructions (Signed)

## 2014-10-14 ENCOUNTER — Other Ambulatory Visit: Payer: Self-pay | Admitting: Internal Medicine

## 2014-10-14 NOTE — Progress Notes (Signed)
Subjective:     Patient ID: Encarnacion Slatesennisa J Bortner, female   DOB: 10/28/1965, 49 y.o.   MRN: [id number]  HPI patient presents with significant ingrown toenail deformity left over right hallux that's painful when pressed and makes it hard to walk and wear shoe gear comfortably   Review of Systems  All other systems reviewed and are negative.      Objective:   Physical Exam  Constitutional: She is oriented to person, place, and time.  Cardiovascular: Intact distal pulses.   Musculoskeletal: Normal range of motion.  Neurological: She is oriented to person, place, and time.  Skin: Skin is warm.  Nursing note and vitals reviewed.  neurovascular status intact with muscle strength adequate range of motion within normal limits. Patient's noted to have deformity of the left over right hallux that's incurvated in the corner and makes it hard to wear shoe gear comfortably     Assessment:     Chronic ingrown toenail deformity    Plan:     Reviewed conservative and surgical treatments and at this time recommended correction of underlying deformity. Explained risk to patient and today I infiltrated 60 Milligan times like Marcaine mixture removed the corner exposed matrix and applied phenol 3 applications 30 seconds followed by sterile dressing and instructed on soaks and reappoint

## 2014-10-14 NOTE — Telephone Encounter (Signed)
Trazadone rx denied pt needs to be seen. Last office visit 11/25/13.

## 2015-01-10 ENCOUNTER — Other Ambulatory Visit: Payer: Self-pay | Admitting: Internal Medicine

## 2015-05-08 ENCOUNTER — Other Ambulatory Visit: Payer: Self-pay

## 2015-05-08 DIAGNOSIS — Z1231 Encounter for screening mammogram for malignant neoplasm of breast: Secondary | ICD-10-CM

## 2015-05-10 ENCOUNTER — Ambulatory Visit
Admission: RE | Admit: 2015-05-10 | Discharge: 2015-05-10 | Disposition: A | Discharge status: Home / Self Care | Payer: 59 | Source: Ambulatory Visit

## 2015-05-10 DIAGNOSIS — Z1231 Encounter for screening mammogram for malignant neoplasm of breast: Secondary | ICD-10-CM

## 2016-02-08 ENCOUNTER — Telehealth: Payer: Self-pay | Admitting: Internal Medicine

## 2016-02-08 NOTE — Telephone Encounter (Signed)
Spoke with pt. She is needing a prescription for CPAP supplies. We have not seen her since 11/2013. Advised her that we could not prescribe new supplies until we see her in the office. She did not want to schedule an appointment at this time. Nothing further was needed.

## 2016-05-10 ENCOUNTER — Ambulatory Visit: Payer: Self-pay | Admitting: Internal Medicine

## 2016-05-29 ENCOUNTER — Ambulatory Visit: Payer: Self-pay | Admitting: Internal Medicine

## 2016-05-30 ENCOUNTER — Encounter: Payer: Self-pay | Admitting: *Deleted

## 2016-05-30 ENCOUNTER — Ambulatory Visit (INDEPENDENT_AMBULATORY_CARE_PROVIDER_SITE_OTHER): Payer: 59 | Admitting: Internal Medicine

## 2016-05-30 VITALS — BP 124/80 | HR 84 | Ht 64.0 in | Wt 297.2 lb

## 2016-05-30 DIAGNOSIS — G4733 Obstructive sleep apnea (adult) (pediatric): Secondary | ICD-10-CM | POA: Diagnosis not present

## 2016-05-30 DIAGNOSIS — G4700 Insomnia, unspecified: Secondary | ICD-10-CM

## 2016-05-30 MED ORDER — SUVOREXANT 20 MG PO TABS: 20.0000 mg | ORAL_TABLET | Freq: Every day | ORAL | 5 refills | Status: DC

## 2016-05-30 NOTE — Assessment & Plan Note (Signed)
Appropriate to continue CPAP. Qualify for replacement machine and she would like to try a different DME. Plan- replacement CPAP machine changed to AutoPap 5-15 as discussed.

## 2016-05-30 NOTE — Progress Notes (Signed)
09/27/11- [age] yoF former smoker followed for OSA, allergic rhinitis, complicated  By ETOH cirrhosis, tobacco   11/25/13- 47 yoF former smoker followed for OSA, Chronic Bronchitis, allergic rhinitis,, complicated  By ETOH cirrhosis,  FOLLOWS FOR: Wears CPAP 11/Advanced every night for about 6-8 hours; pressure works well for patient; not due for supplies at this time. Quit smoking last year after we called report of CXR showing COPD/ chronic bronchitis changes. CXR 11/11/12 IMPRESSION:  COPD/chronic bronchitis, without acute superimposed process.  Original Report Authenticated By: Jeronimo GreavesKyle Talbot, M.D.  05/30/2016-50 year old female former smoker followed for OSA, Insomnia, chronic bronchitis, allergic rhinitis,  complicated by EtOH cirrhosis NPSG 04/28/02- AHI 20/ hr, desaturation to 83%, body weight 215 lbs CPAP 11/Advanced pt was told she was in need of a new cpap. pt states stop taking clonazePAM because another MD told her that it can cause alzheimer. 50% chance. pt has been off med. for a month and is miserable, can't sleep.  She asks permission to change DME company. Comfortable with CPAP but still having trouble initiating and maintaining sleep without clonazepam which she stopped a month ago. Asks about alternatives. Continues trazodone 300 mg.  Denies cough or wheeze on lisinopril.  ROS-see HPI Constitutional:   No-   weight loss, night sweats, fevers, chills, fatigue, lassitude. HEENT:   No-  headaches, difficulty swallowing, tooth/dental problems, sore throat,       No-  sneezing, itching, ear ache, +nasal congestion, post nasal drip,  CV:  No-   chest pain, orthopnea, PND, swelling in lower extremities, anasarca, dizziness, palpitations Resp: No- acute  shortness of breath with exertion or at rest.              No-   productive cough,  Some non-productive cough,  No- coughing up of blood.              No-   change in color of mucus.  No- wheezing.   Skin: No-   rash or lesions. GI:   No-   heartburn, indigestion, abdominal pain, nausea, vomiting,  GU:  MS:  No-   joint pain or swelling.  .   Neuro-     nothing unusual Psych:  No- change in mood or affect. Some depression or anxiety.  No memory loss.  OBJ- Physical Exam General- Alert, Oriented, Affect-appropriate, Distress- none acute, +very obese Skin- rash-none, lesions- none, excoriation- none Lymphadenopathy- none Head- atraumatic            Eyes- Gross vision intact, PERRLA, conjunctivae and secretions clear            Ears- normal            Nose- +mild turbinate edema, no-Septal dev, +mucus bridging , polyps, erosion, perforation             Throat- Mallampati III-IV , mucosa clear , drainage- none, tonsils- atrophic Neck- flexible , trachea midline, no stridor , thyroid nl, carotid no bruit Chest - symmetrical excursion , unlabored           Heart/CV- RRR , no murmur , no gallop  , no rub, nl s1 s2                           - JVD- none , edema- none, stasis changes- none, varices- none           Lung- clear to P&A, wheeze- none, cough- none , dullness-none, rub- none  Chest wall-  Abd-  Br/ Gen/ Rectal- Not done, not indicated Extrem- cyanosis- none, clubbing, none, atrophy- none, strength- nl Neuro- grossly intact to observation

## 2016-05-30 NOTE — Patient Instructions (Addendum)
Script printed for Belsomra 20 mg at bedtime for sleep  Ok to also take melatonin otc, about 6 mg per night  [facility] DME for service issues            Replacement of old CPAP machine auto 5-15, mask of choice, humidifier, supplies, AirView    Dx OSA

## 2016-05-30 NOTE — Assessment & Plan Note (Signed)
Appropriate to reinforce education about good sleep habits and to explore alternatives to clonazepam. Unfortunately she has already been off of clonazepam a month so we are not concerned about withdrawal or rebound. Plan- Try Belsomra with melatonin.

## 2016-06-07 ENCOUNTER — Telehealth: Payer: Self-pay | Admitting: Internal Medicine

## 2016-06-07 MED ORDER — CLONAZEPAM 1 MG PO TABS: ORAL_TABLET | ORAL | 5 refills | Status: DC

## 2016-06-07 NOTE — Telephone Encounter (Signed)
Called and spoke with pt and she is aware of CY recs to refill the clonazepam. This has been called to her pharmacy. Nothing further is needed.

## 2016-06-07 NOTE — Telephone Encounter (Signed)
Ok to d/c Belsomra and refill previous clonazepam order x 6 months

## 2016-06-07 NOTE — Telephone Encounter (Signed)
Pt called and stated that the belsomra requires a PA and the pt is requesting to go back to the clonazepam. CY please advise. Thanks  Last ov--05/30/16 Next ov--08/30/16  Allergies  Allergen Reactions  . Ultram [Tramadol Hcl] Other (See Comments)    Hallucinations, voices.within a few days of taking the medication

## 2016-08-30 ENCOUNTER — Ambulatory Visit: Payer: Self-pay | Admitting: Internal Medicine

## 2016-11-07 ENCOUNTER — Telehealth: Payer: Self-pay | Admitting: Internal Medicine

## 2016-11-07 MED ORDER — TRAZODONE HCL 300 MG PO TABS: 300.0000 mg | ORAL_TABLET | Freq: Every day | ORAL | 1 refills | Status: DC

## 2016-11-07 NOTE — Telephone Encounter (Signed)
rx for the trazodone has been called to her pharmacy.  I have called the pt to make her aware. Nothing further is needed.

## 2016-11-07 NOTE — Telephone Encounter (Signed)
Ok to refill 

## 2016-11-07 NOTE — Telephone Encounter (Signed)
Pt is requesting refills on trazodone . Last refilled on 11-25-13 # 90 with 3 refills. Last OV 05-30-16 with an pending apt for 11-27-16.  CY please advise on refills. Thanks.

## 2016-11-27 ENCOUNTER — Ambulatory Visit: Payer: Self-pay | Admitting: Internal Medicine

## 2016-12-03 ENCOUNTER — Telehealth: Payer: Self-pay | Admitting: Internal Medicine

## 2016-12-03 MED ORDER — CLONAZEPAM 1 MG PO TABS: ORAL_TABLET | ORAL | 5 refills | Status: AC

## 2016-12-03 NOTE — Telephone Encounter (Signed)
rx called in to pharmacy.  Left detailed message making pt aware.  Recall placed in chart for 05/2017 with CY.  Nothing further needed.

## 2016-12-03 NOTE — Telephone Encounter (Signed)
Left detailed message at pt's request, advising that we were forwarding rx request to CY, and that we would call again after receiving his response.  Pt requesting refill on Klonopin  tab. Last refill: 06/07/16 #30 with 5 refills, 1 po qhs prn sleep. Last ov: 10.26.17 Next ov: none  CY please advise on refill.  Thanks.

## 2016-12-03 NOTE — Telephone Encounter (Signed)
Ok to refill x 6 months We need to see her once a year please

## 2017-04-21 ENCOUNTER — Other Ambulatory Visit: Payer: Self-pay | Admitting: Internal Medicine

## 2017-04-23 NOTE — Telephone Encounter (Signed)
Ok to refill this time for 90 days. We will need to see her for 1 year ov f/u before future refills.

## 2017-04-23 NOTE — Telephone Encounter (Signed)
CY Please advise on refill. Thanks.  

## 2017-07-23 ENCOUNTER — Other Ambulatory Visit: Payer: Self-pay | Admitting: Internal Medicine

## 2017-12-16 ENCOUNTER — Other Ambulatory Visit: Payer: Self-pay | Admitting: Obstetrics and Gynecology

## 2017-12-16 DIAGNOSIS — Z1231 Encounter for screening mammogram for malignant neoplasm of breast: Secondary | ICD-10-CM

## 2018-01-28 ENCOUNTER — Ambulatory Visit
Admission: RE | Admit: 2018-01-28 | Discharge: 2018-01-28 | Disposition: A | Discharge status: Home / Self Care | Payer: BLUE CROSS/BLUE SHIELD | Source: Ambulatory Visit | Attending: Obstetrics and Gynecology | Admitting: Obstetrics and Gynecology

## 2018-01-28 DIAGNOSIS — Z1231 Encounter for screening mammogram for malignant neoplasm of breast: Secondary | ICD-10-CM

## 2018-01-29 ENCOUNTER — Other Ambulatory Visit: Payer: Self-pay | Admitting: Obstetrics and Gynecology

## 2018-01-29 DIAGNOSIS — R928 Other abnormal and inconclusive findings on diagnostic imaging of breast: Secondary | ICD-10-CM

## 2018-02-09 ENCOUNTER — Ambulatory Visit
Admission: RE | Admit: 2018-02-09 | Discharge: 2018-02-09 | Disposition: A | Discharge status: Home / Self Care | Payer: BLUE CROSS/BLUE SHIELD | Source: Ambulatory Visit | Attending: Obstetrics and Gynecology | Admitting: Obstetrics and Gynecology

## 2018-02-09 ENCOUNTER — Other Ambulatory Visit: Payer: Self-pay

## 2018-02-09 ENCOUNTER — Other Ambulatory Visit: Payer: Self-pay | Admitting: Obstetrics and Gynecology

## 2018-02-09 DIAGNOSIS — N6001 Solitary cyst of right breast: Secondary | ICD-10-CM

## 2018-02-09 DIAGNOSIS — R928 Other abnormal and inconclusive findings on diagnostic imaging of breast: Secondary | ICD-10-CM

## 2018-07-13 ENCOUNTER — Telehealth: Payer: Self-pay | Admitting: Internal Medicine

## 2018-07-13 NOTE — Telephone Encounter (Signed)
lmtcb for pt.  Pt has not been seen since 2017 with no office visit scheduled, must be seen before additional supplies can be ordered.  Will need office visit with CY or a NP.

## 2018-07-14 NOTE — Telephone Encounter (Signed)
Called and spoke with patient. There was an appointment with Dr. Maple HudsonYoung available on 07/16/18 at 1630. Patient scheduled then for renewal of supplies  Nothing further needed

## 2018-07-14 NOTE — Telephone Encounter (Signed)
Pt is calling back [phone] please leave a detailed message

## 2018-07-14 NOTE — Telephone Encounter (Signed)
Attempted to call pt but unable to reach her. Left message for pt to return call. 

## 2018-07-16 ENCOUNTER — Encounter: Payer: Self-pay | Admitting: Internal Medicine

## 2018-07-16 ENCOUNTER — Ambulatory Visit (INDEPENDENT_AMBULATORY_CARE_PROVIDER_SITE_OTHER): Payer: BLUE CROSS/BLUE SHIELD | Admitting: Internal Medicine

## 2018-07-16 VITALS — BP 142/80 | HR 96 | Ht 64.0 in | Wt 306.4 lb

## 2018-07-16 DIAGNOSIS — Z23 Encounter for immunization: Secondary | ICD-10-CM | POA: Diagnosis not present

## 2018-07-16 DIAGNOSIS — J449 Chronic obstructive pulmonary disease, unspecified: Secondary | ICD-10-CM

## 2018-07-16 DIAGNOSIS — G4733 Obstructive sleep apnea (adult) (pediatric): Secondary | ICD-10-CM | POA: Diagnosis not present

## 2018-07-16 NOTE — Patient Instructions (Signed)
Order- Flu vax standard  Order- DME [facility]- please replace old CPAP machine, change to auto 5-20, replace mask of choice, supplies, humidifier, AirView Patient has  Been compliant,  Order- CXR    Dx COPD mixed type  Order- please schedule PFT    Dx COPD mixed type  Please call as needed

## 2018-07-16 NOTE — Progress Notes (Signed)
HPI  F former smoker followed for OSA, allergic rhinitis, complicated by ETOH cirrhosis, tobacco NPSG 04/28/02- AHI 20/ hr, desaturation to 83%, body weight 215 lbs  ------------------------------------------------------------------------ 05/30/2016-52 year old female former smoker followed for OSA, Insomnia, chronic bronchitis, allergic rhinitis,  complicated by EtOH cirrhosis NPSG 04/28/02- AHI 20/ hr, desaturation to 83%, body weight 215 lbs CPAP 11/Advanced pt was told she was in need of a new cpap. pt states stop taking clonazePAM because another MD told her that it can cause alzheimer. 50% chance. pt has been off med. for a month and is miserable, can't sleep.  She asks permission to change DME company. Comfortable with CPAP but still having trouble initiating and maintaining sleep without clonazepam which she stopped a month ago. Asks about alternatives. Continues trazodone 300 mg.  Denies cough or wheeze on lisinopril.  07/16/2018- 52 year old female former smoker followed for OSA, Insomnia, chronic bronchitis, allergic rhinitis, complicated by EtOH cirrhosis, DM 2, HBP, gastroparesis, CPap 11/ Advanced She reports being very compliant with CPAP "every night" but asks replacement mask.  Machine is old.  She sleeps better with CPAP than without. Mentions her COPD diagnosis but denies routine cough or wheeze and we agreed we could assess this next visit.  Has not smoked in several years.   ROS-see HPI     + = positive Constitutional:   No-   weight loss, night sweats, fevers, chills, fatigue, lassitude. HEENT:   No-  headaches, difficulty swallowing, tooth/dental problems, sore throat,       No-  sneezing, itching, ear ache, +nasal congestion, post nasal drip,  CV:  No-   chest pain, orthopnea, PND, swelling in lower extremities, anasarca, dizziness, palpitations Resp: No- acute  shortness of breath with exertion or at rest.              No-   productive cough,  Some non-productive  cough,  No- coughing up of blood.              No-   change in color of mucus.  No- wheezing.   Skin: No-   rash or lesions. GI:  No-   heartburn, indigestion, abdominal pain, nausea, vomiting,  GU:  MS:  No-   joint pain or swelling.  .   Neuro-     nothing unusual Psych:  No- change in mood or affect. Some depression or anxiety.  No memory loss.  OBJ- Physical Exam General- Alert, Oriented, Affect-appropriate, Distress- none acute, + morbidly obese Skin- rash-none, lesions- none, excoriation- none Lymphadenopathy- none Head- atraumatic            Eyes- Gross vision intact, PERRLA, conjunctivae and secretions clear            Ears- normal            Nose- +clear, no-Septal dev , polyps, erosion, perforation             Throat- Mallampati III-IV , mucosa clear , drainage- none, tonsils- atrophic Neck- flexible , trachea midline, no stridor , thyroid nl, carotid no bruit Chest - symmetrical excursion , unlabored           Heart/CV- RRR , no murmur , no gallop  , no rub, nl s1 s2                           - JVD- none , edema- none, stasis changes- none, varices- none  Lung- clear to P&A, wheeze- none, cough- none , dullness-none, rub- none           Chest wall-  Abd-  Br/ Gen/ Rectal- Not done, not indicated Extrem- cyanosis- none, clubbing, none, atrophy- none, strength- nl Neuro- grossly intact to observation

## 2018-07-17 DIAGNOSIS — J449 Chronic obstructive pulmonary disease, unspecified: Secondary | ICD-10-CM | POA: Insufficient documentation

## 2018-07-17 NOTE — Assessment & Plan Note (Signed)
She describes good compliance and control.  No download available.  Machine is old.  This is a good time to replace without AutoPap as discussed with her. Plan-replace old machine, changing to AutoPap 5-20

## 2018-07-17 NOTE — Assessment & Plan Note (Signed)
She has quit smoking.  Old diagnosis of chronic bronchitis but without symptoms now of cough or wheeze.  She says she has been diagnosed with COPD.  I agreed to update and assess. Plan-order PFT, CXR

## 2018-08-13 ENCOUNTER — Other Ambulatory Visit: Payer: Self-pay

## 2018-08-28 ENCOUNTER — Ambulatory Visit
Admission: RE | Admit: 2018-08-28 | Discharge: 2018-08-28 | Disposition: A | Discharge status: Home / Self Care | Payer: BLUE CROSS/BLUE SHIELD | Source: Ambulatory Visit | Attending: Obstetrics and Gynecology | Admitting: Obstetrics and Gynecology

## 2018-08-28 ENCOUNTER — Other Ambulatory Visit: Payer: Self-pay | Admitting: Obstetrics and Gynecology

## 2018-08-28 DIAGNOSIS — N6001 Solitary cyst of right breast: Secondary | ICD-10-CM

## 2018-10-18 ENCOUNTER — Encounter: Payer: Self-pay | Admitting: Internal Medicine

## 2018-10-20 ENCOUNTER — Ambulatory Visit (INDEPENDENT_AMBULATORY_CARE_PROVIDER_SITE_OTHER): Payer: BLUE CROSS/BLUE SHIELD | Admitting: Internal Medicine

## 2018-10-20 ENCOUNTER — Encounter: Payer: Self-pay | Admitting: Internal Medicine

## 2018-10-20 ENCOUNTER — Ambulatory Visit: Payer: BLUE CROSS/BLUE SHIELD | Admitting: Internal Medicine

## 2018-10-20 ENCOUNTER — Other Ambulatory Visit: Payer: Self-pay

## 2018-10-20 ENCOUNTER — Ambulatory Visit (INDEPENDENT_AMBULATORY_CARE_PROVIDER_SITE_OTHER): Payer: BLUE CROSS/BLUE SHIELD

## 2018-10-20 VITALS — BP 142/70 | HR 94 | Ht 64.0 in | Wt 309.0 lb

## 2018-10-20 DIAGNOSIS — Z23 Encounter for immunization: Secondary | ICD-10-CM

## 2018-10-20 DIAGNOSIS — J449 Chronic obstructive pulmonary disease, unspecified: Secondary | ICD-10-CM

## 2018-10-20 DIAGNOSIS — G4733 Obstructive sleep apnea (adult) (pediatric): Secondary | ICD-10-CM | POA: Diagnosis not present

## 2018-10-20 LAB — PULMONARY FUNCTION TEST
DL/VA % pred: 116 %
DL/VA: 4.99 ml/min/mmHg/L
DLCO UNC % PRED: 104 %
DLCO unc: 21.59 ml/min/mmHg
FEF 25-75 PRE: 2.6 L/s
FEF 25-75 Post: 2.82 L/sec
FEF2575-%CHANGE-POST: 8 %
FEF2575-%PRED-POST: 108 %
FEF2575-%Pred-Pre: 99 %
FEV1-%CHANGE-POST: 1 %
FEV1-%Pred-Post: 84 %
FEV1-%Pred-Pre: 82 %
FEV1-POST: 2.28 L
FEV1-Pre: 2.24 L
FEV1FVC-%CHANGE-POST: 0 %
FEV1FVC-%PRED-PRE: 105 %
FEV6-%Change-Post: 1 %
FEV6-%Pred-Post: 81 %
FEV6-%Pred-Pre: 80 %
FEV6-Post: 2.74 L
FEV6-Pre: 2.69 L
FEV6FVC-%Pred-Post: 103 %
FEV6FVC-%Pred-Pre: 103 %
FVC-%CHANGE-POST: 1 %
FVC-%PRED-POST: 79 %
FVC-%PRED-PRE: 77 %
FVC-POST: 2.74 L
FVC-PRE: 2.69 L
POST FEV1/FVC RATIO: 83 %
PRE FEV6/FVC RATIO: 100 %
Post FEV6/FVC ratio: 100 %
Pre FEV1/FVC ratio: 83 %
RV % PRED: 79 %
RV: 1.48 L
TLC % pred: 81 %
TLC: 4.14 L

## 2018-10-20 MED ORDER — ALBUTEROL SULFATE HFA 108 (90 BASE) MCG/ACT IN AERS: 2.0000 | INHALATION_SPRAY | Freq: Four times a day (QID) | RESPIRATORY_TRACT | 6 refills | Status: AC | PRN

## 2018-10-20 NOTE — Patient Instructions (Addendum)
Letter written for work  Order- DME [facility]- please change CPAP to auto 8-20, continue mask of choice, humidifier, supplies, AirView/ card  Order- Pneumovax- 23 pneumonia vaccine  Script sent for albuterol rescue inhaler    Inhale 2 puffs, every 6 hours, if needed   Ok to see how you feel without the Flovent. If you find you are needing the albuterol inhaler more than once or twice daily, add the Flovent back.

## 2018-10-20 NOTE — Assessment & Plan Note (Signed)
She continues to benefit from CPAP with improved sleep.  Would like to try a higher starting pressure. Plan-change pressure range to 8-20

## 2018-10-20 NOTE — Assessment & Plan Note (Signed)
Allowing for body habitus, PFT shows better flow, volume and Diffusion than I might have expected. Plan- albuterol rescue inhaler

## 2018-10-20 NOTE — Progress Notes (Signed)
Full PFT performed today. °

## 2018-10-20 NOTE — Progress Notes (Signed)
HPI  F former smoker followed for OSA, allergic rhinitis, complicated by ETOH cirrhosis, tobacco NPSG 04/28/02- AHI 20/ hr, desaturation to 83%, body weight 215 lbs  ------------------------------------------------------------------------ 07/16/2018- 53 year old female former smoker followed for OSA, Insomnia, chronic bronchitis, allergic rhinitis, complicated by EtOH cirrhosis, DM 2, HBP, gastroparesis, CPap 11/ [facility] She reports being very compliant with CPAP "every night" but asks replacement mask.  Machine is old.  She sleeps better with CPAP than without. Mentions her COPD diagnosis but denies routine cough or wheeze and we agreed we could assess this next visit.  Has not smoked in several years.   10/20/2018-  53 year old female former smoker followed for OSA, Insomnia, COPD, allergic rhinitis, complicated by EtOH cirrhosis, DM 2, HBP, gastroparesis, Body weight today 309 lbs CPap 5-20/ [facility]>> today 8-20 Download compliance 100%, AHI 0.5/ hr -----breathing at baseline; on CPAP, going well, but feels like pressure settings may be too high  PFT 10/20/2018- restriction of exhaled volume, minimal obsruction, no response to dilator, normal TLC, normal Diffusion CXR 10/20/2018- Density RLLung zone, c/w atelectasis. Pending radiologist report CPAP starts too slowly for comfort. We are going to try raising the starting pressure. Occ notes wheezing if she turns her head. Remembers albuterol rescue inhaler as more useful than current Flovent disk. 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 She hopes a letter from [place] would indicate she should work from home if possible, because she is at higher risk of covid-19 complication.   ROS-see HPI     + = positive Constitutional:   No-   weight loss, night sweats, fevers, chills, fatigue, lassitude. HEENT:   No-  headaches, difficulty swallowing, tooth/dental problems, sore throat,       No-  sneezing, itching, ear ache, +nasal  congestion, post nasal drip,  CV:  No-   chest pain, orthopnea, PND, swelling in lower extremities, anasarca, dizziness, palpitations Resp: No- acute  shortness of breath with exertion or at rest.              No-   productive cough,  Some non-productive cough,  No- coughing up of blood.              No-   change in color of mucus.  + wheezing.   Skin: No-   rash or lesions. GI:  No-   heartburn, indigestion, abdominal pain, nausea, vomiting,  GU:  MS:  No-   joint pain or swelling.  .   Neuro-     nothing unusual Psych:  No- change in mood or affect. Some depression or anxiety.  No memory loss.  OBJ- Physical Exam General- Alert, Oriented, Affect-appropriate, Distress- none acute, + morbidly obese Skin- rash-none, lesions- none, excoriation- none Lymphadenopathy- none Head- atraumatic            Eyes- Gross vision intact, PERRLA, conjunctivae and secretions clear            Ears- normal            Nose- +clear, no-Septal dev , polyps, erosion, perforation             Throat- Mallampati III-IV , mucosa clear , drainage- none, tonsils- atrophic Neck- flexible , trachea midline, no stridor , thyroid nl, carotid no bruit Chest - symmetrical excursion , unlabored           Heart/CV- RRR , no murmur , no gallop  , no rub, nl s1 s2                           -  JVD- none , edema- none, stasis changes- none, varices- none           Lung- clear to P&A, wheeze- none, cough- none , dullness-none, rub- none           Chest wall-  Abd-  Br/ Gen/ Rectal- Not done, not indicated Extrem- cyanosis- none, clubbing, none, atrophy- none, strength- nl Neuro- grossly intact to observation

## 2018-10-26 ENCOUNTER — Telehealth: Payer: Self-pay | Admitting: Internal Medicine

## 2018-10-26 NOTE — Telephone Encounter (Signed)
Called and spoke with pt stating to her the information per Buelah Manis. Pt expressed understanding. Nothing further needed.

## 2018-10-26 NOTE — Telephone Encounter (Signed)
Primary Pulmonologist: Young Last office visit and with whom: 10/20/2018 with [facility] What do we see them for (pulmonary problems):OSA, COPD mixed Last OV assessment/plan:  Instructions   Return in about 6 months (around 04/22/2019).  Letter written for work  Order- DME [facility]- please change CPAP to auto 8-20, continue mask of choice, humidifier, supplies, AirView/ card  Order- Pneumovax- 23 pneumonia vaccine  Script sent for albuterol rescue inhaler    Inhale 2 puffs, every 6 hours, if needed   Ok to see how you feel without the Flovent. If you find you are needing the albuterol inhaler more than once or twice daily, add the Flovent back.       Was appointment offered to patient (explain)?  Pt wants to know recommendations to help with her symptoms   Reason for call: Called and spoke with pt who stated last night 3/22, she developed a fever of 102.2 but states she has been running a low grade fever ranging 99.5 3 days ago and just kept going up even after taking ibuprofen to see if it would help with her temp.  Pt also has had complaints of dry cough which she has also had x3 days but cough has become progressively worse, headaches, body aches, and SOB. Pt has taken cough drops to see if that would help with her cough.  Pt denies any recent traveling and denies being around anyone that has been sick that she knows of. Pt also stated that she received a letter from work stating that she could either request a leave of absence due to the coronavirus if she feels she needs to stay out of work or use vacation time and pt is unsure of what she needs to do.  Beth, please advise recs for pt. Thanks!

## 2018-10-26 NOTE — Telephone Encounter (Signed)
At this time we are recommending patients who have symptoms treat this like the flu and stay home for two weeks or until they are free from fever for 3 days straight and free from respiratory symptoms for 7 days. Advise checking temperature with a thermometer. Encourage pushing oral fluids and Tylenol as needed. They can call [phone] to be screened for covid testing which is limited right now.  She should take tylenol for fever instead of Ibuprofen. Delsym twice daily for cough. Stay well hydrated, aim for eight 8oz glassed of fluid or more a day. We can mail note that states that she is high risk for Covid d/t COPD.

## 2018-11-09 ENCOUNTER — Telehealth: Payer: Self-pay | Admitting: Internal Medicine

## 2018-11-09 ENCOUNTER — Encounter: Payer: Self-pay | Admitting: Internal Medicine

## 2018-11-09 NOTE — Telephone Encounter (Signed)
Called and spoke with pt who stated her BP has been elevated and wants to know if the rescue inhaler could cause her BP to be higher.  Pt also is wanting to know if she could get a letter for her employer to have her stay out of work due to her diagnoses of COPD/asthma. Pt stated that her job is trying to get it to where people can work from home but until this happens, she is wanting to know if the letter could be written to write her out of work until April 29 or until she could get a computer to be able to work from home. 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 Maple Hudson, please advise on all this for pt. Thanks!

## 2018-11-09 NOTE — Progress Notes (Signed)
Out of work letter as requested, until April 29 or she can get a home computer to work from home.

## 2018-11-09 NOTE — Telephone Encounter (Signed)
Called and spoke with patient today regarding [facility] recommendations Pt verbalized and expressed understanding Pt advised that she would like the letter to be sent to her mychart account Advised pt that she was not set up with mychart at this time activation was sent to pt's phone just now She will activate her mychart account today Once completed letter will be sent to her mychart Pt is now active in [place], letter has been sent today Nothing further needed at this time.

## 2018-11-09 NOTE — Telephone Encounter (Signed)
Letter has been sent. Inhalers can be slightly stimulating, but not usually enough to cause sustained rise in BP.  If necessay, she can try skipping the inhaler  For a few days to see if BP gets better.

## 2019-03-05 ENCOUNTER — Other Ambulatory Visit: Payer: BLUE CROSS/BLUE SHIELD

## 2019-03-12 ENCOUNTER — Other Ambulatory Visit: Payer: Self-pay

## 2019-03-12 ENCOUNTER — Ambulatory Visit
Admission: RE | Admit: 2019-03-12 | Discharge: 2019-03-12 | Disposition: A | Discharge status: Home / Self Care | Payer: BC Managed Care – PPO | Source: Ambulatory Visit | Attending: Obstetrics and Gynecology | Admitting: Obstetrics and Gynecology

## 2019-03-12 ENCOUNTER — Other Ambulatory Visit: Payer: Self-pay | Admitting: Obstetrics and Gynecology

## 2019-03-12 ENCOUNTER — Ambulatory Visit: Payer: BC Managed Care – PPO

## 2019-03-12 DIAGNOSIS — N6310 Unspecified lump in the right breast, unspecified quadrant: Secondary | ICD-10-CM

## 2019-03-12 DIAGNOSIS — N6001 Solitary cyst of right breast: Secondary | ICD-10-CM

## 2019-04-23 ENCOUNTER — Ambulatory Visit: Payer: BLUE CROSS/BLUE SHIELD | Admitting: Internal Medicine

## 2020-05-12 ENCOUNTER — Telehealth: Payer: Self-pay | Admitting: Internal Medicine

## 2020-05-12 NOTE — Telephone Encounter (Signed)
Lm for patient.  

## 2020-05-15 NOTE — Telephone Encounter (Signed)
Spoke with pt. States that since her calling she went to the [facility] and got an updated Tdap. Nothing further was needed.

## 2020-08-05 HISTORY — PX: COLONOSCOPY WITH ESOPHAGOGASTRODUODENOSCOPY (EGD): SHX5779

## 2020-12-18 DIAGNOSIS — M25562 Pain in left knee: Secondary | ICD-10-CM | POA: Diagnosis not present

## 2020-12-27 DIAGNOSIS — J410 Simple chronic bronchitis: Secondary | ICD-10-CM | POA: Diagnosis not present

## 2020-12-27 DIAGNOSIS — R718 Other abnormality of red blood cells: Secondary | ICD-10-CM | POA: Diagnosis not present

## 2020-12-27 DIAGNOSIS — R0600 Dyspnea, unspecified: Secondary | ICD-10-CM | POA: Diagnosis not present

## 2020-12-27 DIAGNOSIS — R0602 Shortness of breath: Secondary | ICD-10-CM | POA: Diagnosis not present

## 2020-12-27 DIAGNOSIS — R5383 Other fatigue: Secondary | ICD-10-CM | POA: Diagnosis not present

## 2020-12-27 DIAGNOSIS — E119 Type 2 diabetes mellitus without complications: Secondary | ICD-10-CM | POA: Diagnosis not present

## 2020-12-28 DIAGNOSIS — R0600 Dyspnea, unspecified: Secondary | ICD-10-CM | POA: Diagnosis not present

## 2020-12-28 DIAGNOSIS — R0602 Shortness of breath: Secondary | ICD-10-CM | POA: Diagnosis not present

## 2020-12-29 DIAGNOSIS — R0989 Other specified symptoms and signs involving the circulatory and respiratory systems: Secondary | ICD-10-CM | POA: Diagnosis not present

## 2020-12-29 DIAGNOSIS — R0602 Shortness of breath: Secondary | ICD-10-CM | POA: Diagnosis not present

## 2020-12-29 DIAGNOSIS — J9811 Atelectasis: Secondary | ICD-10-CM | POA: Diagnosis not present

## 2020-12-29 DIAGNOSIS — J449 Chronic obstructive pulmonary disease, unspecified: Secondary | ICD-10-CM | POA: Diagnosis not present

## 2020-12-29 DIAGNOSIS — J811 Chronic pulmonary edema: Secondary | ICD-10-CM | POA: Diagnosis not present

## 2020-12-29 DIAGNOSIS — I517 Cardiomegaly: Secondary | ICD-10-CM | POA: Diagnosis not present

## 2020-12-29 DIAGNOSIS — J918 Pleural effusion in other conditions classified elsewhere: Secondary | ICD-10-CM | POA: Diagnosis not present

## 2021-01-05 DIAGNOSIS — H40009 Preglaucoma, unspecified, unspecified eye: Secondary | ICD-10-CM | POA: Diagnosis not present

## 2021-01-05 DIAGNOSIS — I10 Essential (primary) hypertension: Secondary | ICD-10-CM | POA: Diagnosis not present

## 2021-01-05 DIAGNOSIS — H40052 Ocular hypertension, left eye: Secondary | ICD-10-CM | POA: Diagnosis not present

## 2021-01-05 DIAGNOSIS — H40059 Ocular hypertension, unspecified eye: Secondary | ICD-10-CM | POA: Diagnosis not present

## 2021-01-05 DIAGNOSIS — E119 Type 2 diabetes mellitus without complications: Secondary | ICD-10-CM | POA: Diagnosis not present

## 2021-01-30 ENCOUNTER — Other Ambulatory Visit: Payer: Self-pay | Admitting: Obstetrics and Gynecology

## 2021-01-30 DIAGNOSIS — Z1231 Encounter for screening mammogram for malignant neoplasm of breast: Secondary | ICD-10-CM

## 2021-01-31 DIAGNOSIS — R0600 Dyspnea, unspecified: Secondary | ICD-10-CM | POA: Diagnosis not present

## 2021-01-31 DIAGNOSIS — F32A Depression, unspecified: Secondary | ICD-10-CM | POA: Diagnosis not present

## 2021-03-05 DIAGNOSIS — R0600 Dyspnea, unspecified: Secondary | ICD-10-CM | POA: Diagnosis not present

## 2021-03-16 DIAGNOSIS — G4733 Obstructive sleep apnea (adult) (pediatric): Secondary | ICD-10-CM | POA: Diagnosis not present

## 2021-04-04 DIAGNOSIS — K7030 Alcoholic cirrhosis of liver without ascites: Secondary | ICD-10-CM | POA: Diagnosis not present

## 2021-04-04 DIAGNOSIS — Z7984 Long term (current) use of oral hypoglycemic drugs: Secondary | ICD-10-CM | POA: Diagnosis not present

## 2021-04-04 DIAGNOSIS — E1165 Type 2 diabetes mellitus with hyperglycemia: Secondary | ICD-10-CM | POA: Diagnosis not present

## 2021-04-04 DIAGNOSIS — S99922A Unspecified injury of left foot, initial encounter: Secondary | ICD-10-CM | POA: Diagnosis not present

## 2021-04-04 DIAGNOSIS — E119 Type 2 diabetes mellitus without complications: Secondary | ICD-10-CM | POA: Diagnosis not present

## 2021-04-04 DIAGNOSIS — E785 Hyperlipidemia, unspecified: Secondary | ICD-10-CM | POA: Diagnosis not present

## 2021-04-23 DIAGNOSIS — R0600 Dyspnea, unspecified: Secondary | ICD-10-CM | POA: Diagnosis not present

## 2021-05-07 ENCOUNTER — Ambulatory Visit
Admission: RE | Admit: 2021-05-07 | Discharge: 2021-05-07 | Disposition: A | Discharge status: Home / Self Care | Payer: BC Managed Care – PPO | Source: Ambulatory Visit | Attending: Obstetrics and Gynecology | Admitting: Obstetrics and Gynecology

## 2021-05-07 ENCOUNTER — Other Ambulatory Visit: Payer: Self-pay

## 2021-05-07 DIAGNOSIS — Z1231 Encounter for screening mammogram for malignant neoplasm of breast: Secondary | ICD-10-CM

## 2021-05-23 DIAGNOSIS — K219 Gastro-esophageal reflux disease without esophagitis: Secondary | ICD-10-CM | POA: Diagnosis not present

## 2021-05-23 DIAGNOSIS — F1021 Alcohol dependence, in remission: Secondary | ICD-10-CM | POA: Diagnosis not present

## 2021-05-23 DIAGNOSIS — E785 Hyperlipidemia, unspecified: Secondary | ICD-10-CM | POA: Diagnosis not present

## 2021-05-23 DIAGNOSIS — Z86010 Personal history of colonic polyps: Secondary | ICD-10-CM | POA: Diagnosis not present

## 2021-05-23 DIAGNOSIS — Z79899 Other long term (current) drug therapy: Secondary | ICD-10-CM | POA: Diagnosis not present

## 2021-05-23 DIAGNOSIS — K7030 Alcoholic cirrhosis of liver without ascites: Secondary | ICD-10-CM | POA: Diagnosis not present

## 2021-07-06 DIAGNOSIS — M7989 Other specified soft tissue disorders: Secondary | ICD-10-CM | POA: Diagnosis not present

## 2021-08-15 DIAGNOSIS — F410 Panic disorder [episodic paroxysmal anxiety] without agoraphobia: Secondary | ICD-10-CM | POA: Diagnosis not present

## 2021-08-15 DIAGNOSIS — R61 Generalized hyperhidrosis: Secondary | ICD-10-CM | POA: Diagnosis not present

## 2021-08-15 DIAGNOSIS — F322 Major depressive disorder, single episode, severe without psychotic features: Secondary | ICD-10-CM | POA: Diagnosis not present

## 2021-08-17 DIAGNOSIS — F332 Major depressive disorder, recurrent severe without psychotic features: Secondary | ICD-10-CM | POA: Diagnosis not present

## 2021-08-22 DIAGNOSIS — G4733 Obstructive sleep apnea (adult) (pediatric): Secondary | ICD-10-CM | POA: Diagnosis not present

## 2021-08-23 DIAGNOSIS — F332 Major depressive disorder, recurrent severe without psychotic features: Secondary | ICD-10-CM | POA: Diagnosis not present

## 2021-08-28 DIAGNOSIS — Z1211 Encounter for screening for malignant neoplasm of colon: Secondary | ICD-10-CM | POA: Diagnosis not present

## 2021-08-28 DIAGNOSIS — F32A Depression, unspecified: Secondary | ICD-10-CM | POA: Diagnosis not present

## 2021-08-28 DIAGNOSIS — Z6841 Body Mass Index (BMI) 40.0 and over, adult: Secondary | ICD-10-CM | POA: Diagnosis not present

## 2021-08-28 DIAGNOSIS — Z7984 Long term (current) use of oral hypoglycemic drugs: Secondary | ICD-10-CM | POA: Diagnosis not present

## 2021-08-28 DIAGNOSIS — F419 Anxiety disorder, unspecified: Secondary | ICD-10-CM | POA: Diagnosis not present

## 2021-08-28 DIAGNOSIS — K7030 Alcoholic cirrhosis of liver without ascites: Secondary | ICD-10-CM | POA: Diagnosis not present

## 2021-08-28 DIAGNOSIS — E785 Hyperlipidemia, unspecified: Secondary | ICD-10-CM | POA: Diagnosis not present

## 2021-08-28 DIAGNOSIS — K5730 Diverticulosis of large intestine without perforation or abscess without bleeding: Secondary | ICD-10-CM | POA: Diagnosis not present

## 2021-08-28 DIAGNOSIS — Z79899 Other long term (current) drug therapy: Secondary | ICD-10-CM | POA: Diagnosis not present

## 2021-08-28 DIAGNOSIS — Z86010 Personal history of colonic polyps: Secondary | ICD-10-CM | POA: Diagnosis not present

## 2021-08-28 DIAGNOSIS — E119 Type 2 diabetes mellitus without complications: Secondary | ICD-10-CM | POA: Diagnosis not present

## 2021-08-28 DIAGNOSIS — E669 Obesity, unspecified: Secondary | ICD-10-CM | POA: Diagnosis not present

## 2021-08-28 DIAGNOSIS — G4733 Obstructive sleep apnea (adult) (pediatric): Secondary | ICD-10-CM | POA: Diagnosis not present

## 2021-08-28 DIAGNOSIS — D123 Benign neoplasm of transverse colon: Secondary | ICD-10-CM | POA: Diagnosis not present

## 2021-08-28 DIAGNOSIS — J449 Chronic obstructive pulmonary disease, unspecified: Secondary | ICD-10-CM | POA: Diagnosis not present

## 2021-08-28 DIAGNOSIS — I10 Essential (primary) hypertension: Secondary | ICD-10-CM | POA: Diagnosis not present

## 2021-08-28 DIAGNOSIS — Z87891 Personal history of nicotine dependence: Secondary | ICD-10-CM | POA: Diagnosis not present

## 2021-08-31 DIAGNOSIS — F332 Major depressive disorder, recurrent severe without psychotic features: Secondary | ICD-10-CM | POA: Diagnosis not present

## 2021-09-03 DIAGNOSIS — K8020 Calculus of gallbladder without cholecystitis without obstruction: Secondary | ICD-10-CM | POA: Diagnosis not present

## 2021-09-03 DIAGNOSIS — K7030 Alcoholic cirrhosis of liver without ascites: Secondary | ICD-10-CM | POA: Diagnosis not present

## 2021-09-05 DIAGNOSIS — F410 Panic disorder [episodic paroxysmal anxiety] without agoraphobia: Secondary | ICD-10-CM | POA: Diagnosis not present

## 2021-09-05 DIAGNOSIS — F322 Major depressive disorder, single episode, severe without psychotic features: Secondary | ICD-10-CM | POA: Diagnosis not present

## 2021-09-05 DIAGNOSIS — F1021 Alcohol dependence, in remission: Secondary | ICD-10-CM | POA: Diagnosis not present

## 2021-09-06 DIAGNOSIS — F332 Major depressive disorder, recurrent severe without psychotic features: Secondary | ICD-10-CM | POA: Diagnosis not present

## 2021-09-18 DIAGNOSIS — F332 Major depressive disorder, recurrent severe without psychotic features: Secondary | ICD-10-CM | POA: Diagnosis not present

## 2021-09-22 DIAGNOSIS — G4733 Obstructive sleep apnea (adult) (pediatric): Secondary | ICD-10-CM | POA: Diagnosis not present

## 2021-09-27 DIAGNOSIS — H40013 Open angle with borderline findings, low risk, bilateral: Secondary | ICD-10-CM | POA: Diagnosis not present

## 2021-09-27 DIAGNOSIS — F332 Major depressive disorder, recurrent severe without psychotic features: Secondary | ICD-10-CM | POA: Diagnosis not present

## 2021-10-11 DIAGNOSIS — F332 Major depressive disorder, recurrent severe without psychotic features: Secondary | ICD-10-CM | POA: Diagnosis not present

## 2021-10-26 DIAGNOSIS — H401111 Primary open-angle glaucoma, right eye, mild stage: Secondary | ICD-10-CM | POA: Diagnosis not present

## 2021-11-01 DIAGNOSIS — F332 Major depressive disorder, recurrent severe without psychotic features: Secondary | ICD-10-CM | POA: Diagnosis not present

## 2021-11-02 DIAGNOSIS — E119 Type 2 diabetes mellitus without complications: Secondary | ICD-10-CM | POA: Diagnosis not present

## 2021-11-02 DIAGNOSIS — E1159 Type 2 diabetes mellitus with other circulatory complications: Secondary | ICD-10-CM | POA: Diagnosis not present

## 2021-11-02 DIAGNOSIS — K7030 Alcoholic cirrhosis of liver without ascites: Secondary | ICD-10-CM | POA: Diagnosis not present

## 2021-11-02 DIAGNOSIS — F322 Major depressive disorder, single episode, severe without psychotic features: Secondary | ICD-10-CM | POA: Diagnosis not present

## 2021-11-02 DIAGNOSIS — J410 Simple chronic bronchitis: Secondary | ICD-10-CM | POA: Diagnosis not present

## 2021-11-02 DIAGNOSIS — I152 Hypertension secondary to endocrine disorders: Secondary | ICD-10-CM | POA: Diagnosis not present

## 2021-11-02 DIAGNOSIS — F410 Panic disorder [episodic paroxysmal anxiety] without agoraphobia: Secondary | ICD-10-CM | POA: Diagnosis not present

## 2021-11-02 DIAGNOSIS — F5101 Primary insomnia: Secondary | ICD-10-CM | POA: Diagnosis not present

## 2021-11-02 DIAGNOSIS — E559 Vitamin D deficiency, unspecified: Secondary | ICD-10-CM | POA: Diagnosis not present

## 2021-11-02 DIAGNOSIS — Z6841 Body Mass Index (BMI) 40.0 and over, adult: Secondary | ICD-10-CM | POA: Diagnosis not present

## 2021-11-02 DIAGNOSIS — E785 Hyperlipidemia, unspecified: Secondary | ICD-10-CM | POA: Diagnosis not present

## 2021-11-08 DIAGNOSIS — F332 Major depressive disorder, recurrent severe without psychotic features: Secondary | ICD-10-CM | POA: Diagnosis not present

## 2021-11-15 DIAGNOSIS — F332 Major depressive disorder, recurrent severe without psychotic features: Secondary | ICD-10-CM | POA: Diagnosis not present

## 2021-11-21 DIAGNOSIS — Z7985 Long-term (current) use of injectable non-insulin antidiabetic drugs: Secondary | ICD-10-CM | POA: Diagnosis not present

## 2021-11-21 DIAGNOSIS — K219 Gastro-esophageal reflux disease without esophagitis: Secondary | ICD-10-CM | POA: Diagnosis not present

## 2021-11-21 DIAGNOSIS — K3184 Gastroparesis: Secondary | ICD-10-CM | POA: Diagnosis not present

## 2021-11-21 DIAGNOSIS — K7030 Alcoholic cirrhosis of liver without ascites: Secondary | ICD-10-CM | POA: Diagnosis not present

## 2021-11-21 DIAGNOSIS — Z79899 Other long term (current) drug therapy: Secondary | ICD-10-CM | POA: Diagnosis not present

## 2021-11-21 DIAGNOSIS — K7460 Unspecified cirrhosis of liver: Secondary | ICD-10-CM | POA: Diagnosis not present

## 2021-11-21 DIAGNOSIS — Z86010 Personal history of colonic polyps: Secondary | ICD-10-CM | POA: Diagnosis not present

## 2021-11-29 DIAGNOSIS — F332 Major depressive disorder, recurrent severe without psychotic features: Secondary | ICD-10-CM | POA: Diagnosis not present

## 2021-12-06 DIAGNOSIS — F332 Major depressive disorder, recurrent severe without psychotic features: Secondary | ICD-10-CM | POA: Diagnosis not present

## 2022-01-07 DIAGNOSIS — F331 Major depressive disorder, recurrent, moderate: Secondary | ICD-10-CM | POA: Diagnosis not present

## 2022-01-07 DIAGNOSIS — F411 Generalized anxiety disorder: Secondary | ICD-10-CM | POA: Diagnosis not present

## 2022-01-09 DIAGNOSIS — R0609 Other forms of dyspnea: Secondary | ICD-10-CM | POA: Diagnosis not present

## 2022-01-09 DIAGNOSIS — I152 Hypertension secondary to endocrine disorders: Secondary | ICD-10-CM | POA: Diagnosis not present

## 2022-01-09 DIAGNOSIS — E1159 Type 2 diabetes mellitus with other circulatory complications: Secondary | ICD-10-CM | POA: Diagnosis not present

## 2022-01-09 DIAGNOSIS — E785 Hyperlipidemia, unspecified: Secondary | ICD-10-CM | POA: Diagnosis not present

## 2022-01-16 DIAGNOSIS — K7460 Unspecified cirrhosis of liver: Secondary | ICD-10-CM | POA: Diagnosis not present

## 2022-02-07 DIAGNOSIS — F331 Major depressive disorder, recurrent, moderate: Secondary | ICD-10-CM | POA: Diagnosis not present

## 2022-02-07 DIAGNOSIS — F411 Generalized anxiety disorder: Secondary | ICD-10-CM | POA: Diagnosis not present

## 2022-02-27 DIAGNOSIS — F331 Major depressive disorder, recurrent, moderate: Secondary | ICD-10-CM | POA: Diagnosis not present

## 2022-02-27 DIAGNOSIS — F411 Generalized anxiety disorder: Secondary | ICD-10-CM | POA: Diagnosis not present

## 2022-03-05 ENCOUNTER — Other Ambulatory Visit: Payer: Self-pay | Admitting: Family Medicine

## 2022-03-05 DIAGNOSIS — Z1231 Encounter for screening mammogram for malignant neoplasm of breast: Secondary | ICD-10-CM

## 2022-03-11 DIAGNOSIS — F331 Major depressive disorder, recurrent, moderate: Secondary | ICD-10-CM | POA: Diagnosis not present

## 2022-03-11 DIAGNOSIS — F411 Generalized anxiety disorder: Secondary | ICD-10-CM | POA: Diagnosis not present

## 2022-03-19 DIAGNOSIS — F331 Major depressive disorder, recurrent, moderate: Secondary | ICD-10-CM | POA: Diagnosis not present

## 2022-03-20 DIAGNOSIS — F331 Major depressive disorder, recurrent, moderate: Secondary | ICD-10-CM | POA: Diagnosis not present

## 2022-03-21 DIAGNOSIS — F331 Major depressive disorder, recurrent, moderate: Secondary | ICD-10-CM | POA: Diagnosis not present

## 2022-03-22 DIAGNOSIS — F331 Major depressive disorder, recurrent, moderate: Secondary | ICD-10-CM | POA: Diagnosis not present

## 2022-03-25 DIAGNOSIS — F331 Major depressive disorder, recurrent, moderate: Secondary | ICD-10-CM | POA: Diagnosis not present

## 2022-03-26 DIAGNOSIS — F331 Major depressive disorder, recurrent, moderate: Secondary | ICD-10-CM | POA: Diagnosis not present

## 2022-03-27 DIAGNOSIS — F331 Major depressive disorder, recurrent, moderate: Secondary | ICD-10-CM | POA: Diagnosis not present

## 2022-03-28 DIAGNOSIS — F331 Major depressive disorder, recurrent, moderate: Secondary | ICD-10-CM | POA: Diagnosis not present

## 2022-03-29 DIAGNOSIS — F331 Major depressive disorder, recurrent, moderate: Secondary | ICD-10-CM | POA: Diagnosis not present

## 2022-04-01 DIAGNOSIS — F331 Major depressive disorder, recurrent, moderate: Secondary | ICD-10-CM | POA: Diagnosis not present

## 2022-04-02 DIAGNOSIS — F331 Major depressive disorder, recurrent, moderate: Secondary | ICD-10-CM | POA: Diagnosis not present

## 2022-04-03 DIAGNOSIS — F331 Major depressive disorder, recurrent, moderate: Secondary | ICD-10-CM | POA: Diagnosis not present

## 2022-04-04 DIAGNOSIS — F331 Major depressive disorder, recurrent, moderate: Secondary | ICD-10-CM | POA: Diagnosis not present

## 2022-04-09 DIAGNOSIS — F331 Major depressive disorder, recurrent, moderate: Secondary | ICD-10-CM | POA: Diagnosis not present

## 2022-04-10 DIAGNOSIS — F331 Major depressive disorder, recurrent, moderate: Secondary | ICD-10-CM | POA: Diagnosis not present

## 2022-04-11 DIAGNOSIS — F331 Major depressive disorder, recurrent, moderate: Secondary | ICD-10-CM | POA: Diagnosis not present

## 2022-04-12 DIAGNOSIS — F411 Generalized anxiety disorder: Secondary | ICD-10-CM | POA: Diagnosis not present

## 2022-04-12 DIAGNOSIS — F331 Major depressive disorder, recurrent, moderate: Secondary | ICD-10-CM | POA: Diagnosis not present

## 2022-04-15 DIAGNOSIS — F331 Major depressive disorder, recurrent, moderate: Secondary | ICD-10-CM | POA: Diagnosis not present

## 2022-04-16 DIAGNOSIS — F331 Major depressive disorder, recurrent, moderate: Secondary | ICD-10-CM | POA: Diagnosis not present

## 2022-04-18 DIAGNOSIS — F331 Major depressive disorder, recurrent, moderate: Secondary | ICD-10-CM | POA: Diagnosis not present

## 2022-04-19 DIAGNOSIS — F331 Major depressive disorder, recurrent, moderate: Secondary | ICD-10-CM | POA: Diagnosis not present

## 2022-04-22 DIAGNOSIS — F331 Major depressive disorder, recurrent, moderate: Secondary | ICD-10-CM | POA: Diagnosis not present

## 2022-04-23 DIAGNOSIS — F331 Major depressive disorder, recurrent, moderate: Secondary | ICD-10-CM | POA: Diagnosis not present

## 2022-04-24 DIAGNOSIS — F331 Major depressive disorder, recurrent, moderate: Secondary | ICD-10-CM | POA: Diagnosis not present

## 2022-04-26 DIAGNOSIS — F331 Major depressive disorder, recurrent, moderate: Secondary | ICD-10-CM | POA: Diagnosis not present

## 2022-04-29 DIAGNOSIS — F331 Major depressive disorder, recurrent, moderate: Secondary | ICD-10-CM | POA: Diagnosis not present

## 2022-05-01 DIAGNOSIS — F331 Major depressive disorder, recurrent, moderate: Secondary | ICD-10-CM | POA: Diagnosis not present

## 2022-05-02 DIAGNOSIS — F331 Major depressive disorder, recurrent, moderate: Secondary | ICD-10-CM | POA: Diagnosis not present

## 2022-05-03 DIAGNOSIS — F331 Major depressive disorder, recurrent, moderate: Secondary | ICD-10-CM | POA: Diagnosis not present

## 2022-05-06 ENCOUNTER — Ambulatory Visit
Admission: RE | Admit: 2022-05-06 | Discharge: 2022-05-06 | Disposition: A | Discharge status: Home / Self Care | Payer: BC Managed Care – PPO | Source: Ambulatory Visit | Attending: Family Medicine | Admitting: Family Medicine

## 2022-05-06 DIAGNOSIS — Z1231 Encounter for screening mammogram for malignant neoplasm of breast: Secondary | ICD-10-CM

## 2022-05-06 DIAGNOSIS — F331 Major depressive disorder, recurrent, moderate: Secondary | ICD-10-CM | POA: Diagnosis not present

## 2022-05-07 DIAGNOSIS — F331 Major depressive disorder, recurrent, moderate: Secondary | ICD-10-CM | POA: Diagnosis not present

## 2022-05-13 DIAGNOSIS — F331 Major depressive disorder, recurrent, moderate: Secondary | ICD-10-CM | POA: Diagnosis not present

## 2022-05-15 DIAGNOSIS — F331 Major depressive disorder, recurrent, moderate: Secondary | ICD-10-CM | POA: Diagnosis not present

## 2022-05-20 DIAGNOSIS — F331 Major depressive disorder, recurrent, moderate: Secondary | ICD-10-CM | POA: Diagnosis not present

## 2022-05-23 DIAGNOSIS — F331 Major depressive disorder, recurrent, moderate: Secondary | ICD-10-CM | POA: Diagnosis not present

## 2022-05-29 DIAGNOSIS — K219 Gastro-esophageal reflux disease without esophagitis: Secondary | ICD-10-CM | POA: Diagnosis not present

## 2022-05-29 DIAGNOSIS — Z7984 Long term (current) use of oral hypoglycemic drugs: Secondary | ICD-10-CM | POA: Diagnosis not present

## 2022-05-29 DIAGNOSIS — K7460 Unspecified cirrhosis of liver: Secondary | ICD-10-CM | POA: Diagnosis not present

## 2022-05-29 DIAGNOSIS — E669 Obesity, unspecified: Secondary | ICD-10-CM | POA: Diagnosis not present

## 2022-05-29 DIAGNOSIS — K3184 Gastroparesis: Secondary | ICD-10-CM | POA: Diagnosis not present

## 2022-05-29 DIAGNOSIS — F322 Major depressive disorder, single episode, severe without psychotic features: Secondary | ICD-10-CM | POA: Diagnosis not present

## 2022-05-29 DIAGNOSIS — E119 Type 2 diabetes mellitus without complications: Secondary | ICD-10-CM | POA: Diagnosis not present

## 2022-05-29 DIAGNOSIS — F1021 Alcohol dependence, in remission: Secondary | ICD-10-CM | POA: Diagnosis not present

## 2022-05-29 DIAGNOSIS — Z6841 Body Mass Index (BMI) 40.0 and over, adult: Secondary | ICD-10-CM | POA: Diagnosis not present

## 2022-05-29 DIAGNOSIS — R0609 Other forms of dyspnea: Secondary | ICD-10-CM | POA: Diagnosis not present

## 2022-05-29 DIAGNOSIS — Z86010 Personal history of colonic polyps: Secondary | ICD-10-CM | POA: Diagnosis not present

## 2022-05-30 DIAGNOSIS — F331 Major depressive disorder, recurrent, moderate: Secondary | ICD-10-CM | POA: Diagnosis not present

## 2022-06-05 DIAGNOSIS — F411 Generalized anxiety disorder: Secondary | ICD-10-CM | POA: Diagnosis not present

## 2022-06-05 DIAGNOSIS — F331 Major depressive disorder, recurrent, moderate: Secondary | ICD-10-CM | POA: Diagnosis not present

## 2022-06-05 DIAGNOSIS — F4312 Post-traumatic stress disorder, chronic: Secondary | ICD-10-CM | POA: Diagnosis not present

## 2022-06-12 DIAGNOSIS — F411 Generalized anxiety disorder: Secondary | ICD-10-CM | POA: Diagnosis not present

## 2022-06-12 DIAGNOSIS — F331 Major depressive disorder, recurrent, moderate: Secondary | ICD-10-CM | POA: Diagnosis not present

## 2022-06-12 DIAGNOSIS — F4312 Post-traumatic stress disorder, chronic: Secondary | ICD-10-CM | POA: Diagnosis not present

## 2022-06-19 DIAGNOSIS — F4312 Post-traumatic stress disorder, chronic: Secondary | ICD-10-CM | POA: Diagnosis not present

## 2022-06-19 DIAGNOSIS — F331 Major depressive disorder, recurrent, moderate: Secondary | ICD-10-CM | POA: Diagnosis not present

## 2022-06-19 DIAGNOSIS — F411 Generalized anxiety disorder: Secondary | ICD-10-CM | POA: Diagnosis not present

## 2022-06-20 DIAGNOSIS — F322 Major depressive disorder, single episode, severe without psychotic features: Secondary | ICD-10-CM | POA: Diagnosis not present

## 2022-06-20 DIAGNOSIS — E1159 Type 2 diabetes mellitus with other circulatory complications: Secondary | ICD-10-CM | POA: Diagnosis not present

## 2022-06-20 DIAGNOSIS — E119 Type 2 diabetes mellitus without complications: Secondary | ICD-10-CM | POA: Diagnosis not present

## 2022-06-20 DIAGNOSIS — R21 Rash and other nonspecific skin eruption: Secondary | ICD-10-CM | POA: Diagnosis not present

## 2022-06-20 DIAGNOSIS — I152 Hypertension secondary to endocrine disorders: Secondary | ICD-10-CM | POA: Diagnosis not present

## 2022-06-20 DIAGNOSIS — J410 Simple chronic bronchitis: Secondary | ICD-10-CM | POA: Diagnosis not present

## 2022-06-20 DIAGNOSIS — L600 Ingrowing nail: Secondary | ICD-10-CM | POA: Diagnosis not present

## 2022-06-20 DIAGNOSIS — L309 Dermatitis, unspecified: Secondary | ICD-10-CM | POA: Diagnosis not present

## 2022-06-25 DIAGNOSIS — F411 Generalized anxiety disorder: Secondary | ICD-10-CM | POA: Diagnosis not present

## 2022-06-25 DIAGNOSIS — F4312 Post-traumatic stress disorder, chronic: Secondary | ICD-10-CM | POA: Diagnosis not present

## 2022-06-25 DIAGNOSIS — F331 Major depressive disorder, recurrent, moderate: Secondary | ICD-10-CM | POA: Diagnosis not present

## 2022-07-01 DIAGNOSIS — G4733 Obstructive sleep apnea (adult) (pediatric): Secondary | ICD-10-CM | POA: Diagnosis not present

## 2022-07-03 DIAGNOSIS — F4312 Post-traumatic stress disorder, chronic: Secondary | ICD-10-CM | POA: Diagnosis not present

## 2022-07-03 DIAGNOSIS — F331 Major depressive disorder, recurrent, moderate: Secondary | ICD-10-CM | POA: Diagnosis not present

## 2022-07-03 DIAGNOSIS — F411 Generalized anxiety disorder: Secondary | ICD-10-CM | POA: Diagnosis not present

## 2022-07-17 DIAGNOSIS — F331 Major depressive disorder, recurrent, moderate: Secondary | ICD-10-CM | POA: Diagnosis not present

## 2022-07-17 DIAGNOSIS — F4312 Post-traumatic stress disorder, chronic: Secondary | ICD-10-CM | POA: Diagnosis not present

## 2022-07-17 DIAGNOSIS — F411 Generalized anxiety disorder: Secondary | ICD-10-CM | POA: Diagnosis not present

## 2022-07-18 DIAGNOSIS — L600 Ingrowing nail: Secondary | ICD-10-CM | POA: Diagnosis not present

## 2022-07-18 DIAGNOSIS — B351 Tinea unguium: Secondary | ICD-10-CM | POA: Diagnosis not present

## 2022-07-18 DIAGNOSIS — M7662 Achilles tendinitis, left leg: Secondary | ICD-10-CM | POA: Diagnosis not present

## 2022-07-18 DIAGNOSIS — M7661 Achilles tendinitis, right leg: Secondary | ICD-10-CM | POA: Diagnosis not present

## 2022-07-26 DIAGNOSIS — K7460 Unspecified cirrhosis of liver: Secondary | ICD-10-CM | POA: Diagnosis not present

## 2022-07-31 DIAGNOSIS — G4733 Obstructive sleep apnea (adult) (pediatric): Secondary | ICD-10-CM | POA: Diagnosis not present

## 2022-08-05 DIAGNOSIS — I739 Peripheral vascular disease, unspecified: Secondary | ICD-10-CM

## 2022-08-05 HISTORY — DX: Peripheral vascular disease, unspecified: I73.9

## 2022-08-14 DIAGNOSIS — F411 Generalized anxiety disorder: Secondary | ICD-10-CM | POA: Diagnosis not present

## 2022-08-14 DIAGNOSIS — F331 Major depressive disorder, recurrent, moderate: Secondary | ICD-10-CM | POA: Diagnosis not present

## 2022-08-14 DIAGNOSIS — F4312 Post-traumatic stress disorder, chronic: Secondary | ICD-10-CM | POA: Diagnosis not present

## 2022-08-23 DIAGNOSIS — K7031 Alcoholic cirrhosis of liver with ascites: Secondary | ICD-10-CM | POA: Diagnosis not present

## 2022-08-23 DIAGNOSIS — E1169 Type 2 diabetes mellitus with other specified complication: Secondary | ICD-10-CM | POA: Diagnosis not present

## 2022-08-23 DIAGNOSIS — G4733 Obstructive sleep apnea (adult) (pediatric): Secondary | ICD-10-CM | POA: Diagnosis not present

## 2022-08-23 DIAGNOSIS — I10 Essential (primary) hypertension: Secondary | ICD-10-CM | POA: Diagnosis not present

## 2022-08-28 DIAGNOSIS — F411 Generalized anxiety disorder: Secondary | ICD-10-CM | POA: Diagnosis not present

## 2022-08-28 DIAGNOSIS — F4312 Post-traumatic stress disorder, chronic: Secondary | ICD-10-CM | POA: Diagnosis not present

## 2022-08-28 DIAGNOSIS — F331 Major depressive disorder, recurrent, moderate: Secondary | ICD-10-CM | POA: Diagnosis not present

## 2022-08-31 DIAGNOSIS — G4733 Obstructive sleep apnea (adult) (pediatric): Secondary | ICD-10-CM | POA: Diagnosis not present

## 2022-09-10 DIAGNOSIS — I10 Essential (primary) hypertension: Secondary | ICD-10-CM | POA: Diagnosis not present

## 2022-09-11 DIAGNOSIS — F411 Generalized anxiety disorder: Secondary | ICD-10-CM | POA: Diagnosis not present

## 2022-09-11 DIAGNOSIS — F331 Major depressive disorder, recurrent, moderate: Secondary | ICD-10-CM | POA: Diagnosis not present

## 2022-09-11 DIAGNOSIS — F4312 Post-traumatic stress disorder, chronic: Secondary | ICD-10-CM | POA: Diagnosis not present

## 2022-09-25 DIAGNOSIS — F4312 Post-traumatic stress disorder, chronic: Secondary | ICD-10-CM | POA: Diagnosis not present

## 2022-09-25 DIAGNOSIS — F331 Major depressive disorder, recurrent, moderate: Secondary | ICD-10-CM | POA: Diagnosis not present

## 2022-09-25 DIAGNOSIS — F411 Generalized anxiety disorder: Secondary | ICD-10-CM | POA: Diagnosis not present

## 2022-10-04 DIAGNOSIS — F331 Major depressive disorder, recurrent, moderate: Secondary | ICD-10-CM | POA: Diagnosis not present

## 2022-10-04 DIAGNOSIS — F411 Generalized anxiety disorder: Secondary | ICD-10-CM | POA: Diagnosis not present

## 2022-10-04 DIAGNOSIS — F4312 Post-traumatic stress disorder, chronic: Secondary | ICD-10-CM | POA: Diagnosis not present

## 2022-10-09 DIAGNOSIS — F331 Major depressive disorder, recurrent, moderate: Secondary | ICD-10-CM | POA: Diagnosis not present

## 2022-10-09 DIAGNOSIS — F4312 Post-traumatic stress disorder, chronic: Secondary | ICD-10-CM | POA: Diagnosis not present

## 2022-10-09 DIAGNOSIS — F411 Generalized anxiety disorder: Secondary | ICD-10-CM | POA: Diagnosis not present

## 2022-10-16 DIAGNOSIS — F331 Major depressive disorder, recurrent, moderate: Secondary | ICD-10-CM | POA: Diagnosis not present

## 2022-10-16 DIAGNOSIS — F411 Generalized anxiety disorder: Secondary | ICD-10-CM | POA: Diagnosis not present

## 2022-10-16 DIAGNOSIS — F4312 Post-traumatic stress disorder, chronic: Secondary | ICD-10-CM | POA: Diagnosis not present

## 2022-11-01 DIAGNOSIS — F4312 Post-traumatic stress disorder, chronic: Secondary | ICD-10-CM | POA: Diagnosis not present

## 2022-11-01 DIAGNOSIS — F331 Major depressive disorder, recurrent, moderate: Secondary | ICD-10-CM | POA: Diagnosis not present

## 2022-11-01 DIAGNOSIS — F411 Generalized anxiety disorder: Secondary | ICD-10-CM | POA: Diagnosis not present

## 2022-11-22 DIAGNOSIS — E7800 Pure hypercholesterolemia, unspecified: Secondary | ICD-10-CM | POA: Diagnosis not present

## 2022-11-22 DIAGNOSIS — E1169 Type 2 diabetes mellitus with other specified complication: Secondary | ICD-10-CM | POA: Diagnosis not present

## 2022-11-22 DIAGNOSIS — K7031 Alcoholic cirrhosis of liver with ascites: Secondary | ICD-10-CM | POA: Diagnosis not present

## 2022-11-22 DIAGNOSIS — I10 Essential (primary) hypertension: Secondary | ICD-10-CM | POA: Diagnosis not present

## 2022-11-22 DIAGNOSIS — Z0000 Encounter for general adult medical examination without abnormal findings: Secondary | ICD-10-CM | POA: Diagnosis not present

## 2022-11-22 DIAGNOSIS — G4733 Obstructive sleep apnea (adult) (pediatric): Secondary | ICD-10-CM | POA: Diagnosis not present

## 2022-11-22 DIAGNOSIS — F418 Other specified anxiety disorders: Secondary | ICD-10-CM | POA: Diagnosis not present

## 2022-12-06 DIAGNOSIS — F4312 Post-traumatic stress disorder, chronic: Secondary | ICD-10-CM | POA: Diagnosis not present

## 2022-12-06 DIAGNOSIS — F411 Generalized anxiety disorder: Secondary | ICD-10-CM | POA: Diagnosis not present

## 2022-12-06 DIAGNOSIS — F331 Major depressive disorder, recurrent, moderate: Secondary | ICD-10-CM | POA: Diagnosis not present

## 2022-12-24 DIAGNOSIS — F4312 Post-traumatic stress disorder, chronic: Secondary | ICD-10-CM | POA: Diagnosis not present

## 2022-12-24 DIAGNOSIS — F331 Major depressive disorder, recurrent, moderate: Secondary | ICD-10-CM | POA: Diagnosis not present

## 2022-12-24 DIAGNOSIS — F411 Generalized anxiety disorder: Secondary | ICD-10-CM | POA: Diagnosis not present

## 2022-12-27 DIAGNOSIS — L602 Onychogryphosis: Secondary | ICD-10-CM | POA: Diagnosis not present

## 2022-12-27 DIAGNOSIS — D1801 Hemangioma of skin and subcutaneous tissue: Secondary | ICD-10-CM | POA: Diagnosis not present

## 2022-12-27 DIAGNOSIS — B353 Tinea pedis: Secondary | ICD-10-CM | POA: Diagnosis not present

## 2022-12-27 DIAGNOSIS — L570 Actinic keratosis: Secondary | ICD-10-CM | POA: Diagnosis not present

## 2022-12-31 DIAGNOSIS — F411 Generalized anxiety disorder: Secondary | ICD-10-CM | POA: Diagnosis not present

## 2022-12-31 DIAGNOSIS — F4312 Post-traumatic stress disorder, chronic: Secondary | ICD-10-CM | POA: Diagnosis not present

## 2022-12-31 DIAGNOSIS — F331 Major depressive disorder, recurrent, moderate: Secondary | ICD-10-CM | POA: Diagnosis not present

## 2023-01-07 DIAGNOSIS — F4312 Post-traumatic stress disorder, chronic: Secondary | ICD-10-CM | POA: Diagnosis not present

## 2023-01-07 DIAGNOSIS — F411 Generalized anxiety disorder: Secondary | ICD-10-CM | POA: Diagnosis not present

## 2023-01-07 DIAGNOSIS — F331 Major depressive disorder, recurrent, moderate: Secondary | ICD-10-CM | POA: Diagnosis not present

## 2023-01-14 DIAGNOSIS — F331 Major depressive disorder, recurrent, moderate: Secondary | ICD-10-CM | POA: Diagnosis not present

## 2023-01-14 DIAGNOSIS — F4312 Post-traumatic stress disorder, chronic: Secondary | ICD-10-CM | POA: Diagnosis not present

## 2023-01-14 DIAGNOSIS — F411 Generalized anxiety disorder: Secondary | ICD-10-CM | POA: Diagnosis not present

## 2023-01-15 DIAGNOSIS — R0989 Other specified symptoms and signs involving the circulatory and respiratory systems: Secondary | ICD-10-CM | POA: Diagnosis not present

## 2023-01-21 DIAGNOSIS — E041 Nontoxic single thyroid nodule: Secondary | ICD-10-CM | POA: Diagnosis not present

## 2023-01-21 DIAGNOSIS — I6523 Occlusion and stenosis of bilateral carotid arteries: Secondary | ICD-10-CM | POA: Diagnosis not present

## 2023-01-24 ENCOUNTER — Other Ambulatory Visit: Payer: Self-pay | Admitting: Internal Medicine

## 2023-01-24 DIAGNOSIS — I6523 Occlusion and stenosis of bilateral carotid arteries: Secondary | ICD-10-CM | POA: Diagnosis not present

## 2023-01-24 DIAGNOSIS — F4310 Post-traumatic stress disorder, unspecified: Secondary | ICD-10-CM | POA: Diagnosis not present

## 2023-01-24 DIAGNOSIS — F418 Other specified anxiety disorders: Secondary | ICD-10-CM | POA: Diagnosis not present

## 2023-01-24 DIAGNOSIS — E041 Nontoxic single thyroid nodule: Secondary | ICD-10-CM

## 2023-01-27 DIAGNOSIS — L600 Ingrowing nail: Secondary | ICD-10-CM | POA: Diagnosis not present

## 2023-02-03 ENCOUNTER — Ambulatory Visit
Admission: RE | Admit: 2023-02-03 | Discharge: 2023-02-03 | Disposition: A | Discharge status: Home / Self Care | Payer: BC Managed Care – PPO | Source: Ambulatory Visit | Attending: Internal Medicine | Admitting: Internal Medicine

## 2023-02-03 DIAGNOSIS — Z4889 Encounter for other specified surgical aftercare: Secondary | ICD-10-CM | POA: Diagnosis not present

## 2023-02-03 DIAGNOSIS — E041 Nontoxic single thyroid nodule: Secondary | ICD-10-CM

## 2023-02-04 DIAGNOSIS — F411 Generalized anxiety disorder: Secondary | ICD-10-CM | POA: Diagnosis not present

## 2023-02-04 DIAGNOSIS — F331 Major depressive disorder, recurrent, moderate: Secondary | ICD-10-CM | POA: Diagnosis not present

## 2023-02-04 DIAGNOSIS — F4312 Post-traumatic stress disorder, chronic: Secondary | ICD-10-CM | POA: Diagnosis not present

## 2023-02-05 ENCOUNTER — Other Ambulatory Visit: Payer: Self-pay | Admitting: Internal Medicine

## 2023-02-05 DIAGNOSIS — F332 Major depressive disorder, recurrent severe without psychotic features: Secondary | ICD-10-CM | POA: Diagnosis not present

## 2023-02-05 DIAGNOSIS — F4312 Post-traumatic stress disorder, chronic: Secondary | ICD-10-CM | POA: Diagnosis not present

## 2023-02-05 DIAGNOSIS — E041 Nontoxic single thyroid nodule: Secondary | ICD-10-CM

## 2023-02-12 DIAGNOSIS — B353 Tinea pedis: Secondary | ICD-10-CM | POA: Diagnosis not present

## 2023-02-12 DIAGNOSIS — L602 Onychogryphosis: Secondary | ICD-10-CM | POA: Diagnosis not present

## 2023-02-20 ENCOUNTER — Other Ambulatory Visit (HOSPITAL_COMMUNITY)
Admission: RE | Admit: 2023-02-20 | Discharge: 2023-02-20 | Disposition: A | Discharge status: Home / Self Care | Payer: BC Managed Care – PPO | Source: Ambulatory Visit | Attending: Internal Medicine | Admitting: Internal Medicine

## 2023-02-20 ENCOUNTER — Ambulatory Visit
Admission: RE | Admit: 2023-02-20 | Discharge: 2023-02-20 | Disposition: A | Discharge status: Home / Self Care | Payer: BC Managed Care – PPO | Source: Ambulatory Visit | Attending: Internal Medicine | Admitting: Internal Medicine

## 2023-02-20 DIAGNOSIS — E041 Nontoxic single thyroid nodule: Secondary | ICD-10-CM | POA: Diagnosis not present

## 2023-02-20 DIAGNOSIS — R896 Abnormal cytological findings in specimens from other organs, systems and tissues: Secondary | ICD-10-CM | POA: Insufficient documentation

## 2023-02-21 LAB — CYTOLOGY - NON PAP

## 2023-02-25 DIAGNOSIS — E042 Nontoxic multinodular goiter: Secondary | ICD-10-CM | POA: Diagnosis not present

## 2023-03-04 DIAGNOSIS — F4312 Post-traumatic stress disorder, chronic: Secondary | ICD-10-CM | POA: Diagnosis not present

## 2023-03-04 DIAGNOSIS — F331 Major depressive disorder, recurrent, moderate: Secondary | ICD-10-CM | POA: Diagnosis not present

## 2023-03-04 DIAGNOSIS — F411 Generalized anxiety disorder: Secondary | ICD-10-CM | POA: Diagnosis not present

## 2023-03-11 ENCOUNTER — Encounter (HOSPITAL_COMMUNITY): Payer: Self-pay

## 2023-03-14 ENCOUNTER — Ambulatory Visit: Payer: Self-pay | Admitting: General Surgery

## 2023-03-14 DIAGNOSIS — E041 Nontoxic single thyroid nodule: Secondary | ICD-10-CM | POA: Diagnosis not present

## 2023-03-14 NOTE — H&P (View-Only) (Signed)
 Chief Complaint: New Consultation (THYROID NODULE)   History of Present Illness: Barbara Douglas is a 57 y.o. female who is seen today as an office consultation for evaluation of New Consultation (THYROID NODULE)     Patient is a 57 year old female, comes in with history of cirrhosis, COPD, obesity, and a right thyroid nodule. Patient states that she was undergoing carotid ultrasound and was found to have a right thyroid nodule.  She subsequently underwent formal ultrasound of her thyroid.  Patient was found to have a 1.7 cm right inferior thyroid nodule.  This was sent for biopsy.  This showed a Bethesda 4 category nodule, follicular type as well as apartment with approximately 50% chance of cancer.   Patient was sent for evaluation and management.   Discussed with the patient she has not had any other symptoms of dysphagia or respiratory distress.   She does state that she previously was taking Ozempic.  She has stopped that.     Review of Systems: A complete review of systems was obtained from the patient.  I have reviewed this information and discussed as appropriate with the patient.  See HPI as well for other ROS.   Review of Systems  Constitutional:  Negative for fever.  HENT:  Negative for congestion.   Eyes:  Negative for blurred vision.  Respiratory:  Negative for cough, shortness of breath and wheezing.   Cardiovascular:  Negative for chest pain and palpitations.  Gastrointestinal:  Negative for heartburn.  Genitourinary:  Negative for dysuria.  Musculoskeletal:  Negative for myalgias.  Skin:  Negative for rash.  Neurological:  Negative for dizziness and headaches.  Psychiatric/Behavioral:  Negative for depression and suicidal ideas.   All other systems reviewed and are negative.     Medical History: Past Medical History      Past Medical History:  Diagnosis Date   Anxiety     Arthritis     Asthma, unspecified asthma severity, unspecified whether complicated,  unspecified whether persistent (HHS-HCC)     Diabetes mellitus without complication (CMS/HHS-HCC)     Glaucoma (increased eye pressure)     Hypertension     Liver disease     Sleep apnea           Problem List  There is no problem list on file for this patient.      Past Surgical History  History reviewed. No pertinent surgical history.      Allergies  No Known Allergies     Medications Ordered Prior to Encounter        Current Outpatient Medications on File Prior to Visit  Medication Sig Dispense Refill   albuterol MDI, PROVENTIL, VENTOLIN, PROAIR, HFA 90 mcg/actuation inhaler Inhale into the lungs at bedtime as needed       ARNUITY ELLIPTA 100 mcg/actuation DsDv Inhale into the lungs       clonazePAM (KLONOPIN) 1 MG tablet Take 1 mg by mouth at bedtime       escitalopram oxalate (LEXAPRO) 10 MG tablet TAKE 0.5 TABLET ONCE DAILY FOR 1 WEEK AND THEN TAKE 1 TABLET ONCE DAILY       gabapentin (NEURONTIN) 300 MG capsule Take 1 capsule by mouth 3 (three) times daily       hydroCHLOROthiazide (HYDRODIURIL) 12.5 MG tablet take 1 tablet by mouth every day in the morning for 90 days       JANUVIA 100 mg tablet Take 100 mg by mouth once daily       lisinopriL-hydroCHLOROthiazide (  ZESTORETIC) 10-12.5 mg tablet Take 1 tablet by mouth once daily       traZODone (DESYREL) 150 MG tablet Take 2 tablets by mouth at bedtime        No current facility-administered medications on file prior to visit.        Family History       Family History  Problem Relation Age of Onset   High blood pressure (Hypertension) Mother     Hyperlipidemia (Elevated cholesterol) Mother     Coronary Artery Disease (Blocked arteries around heart) Mother          Tobacco Use History  Social History        Tobacco Use  Smoking Status Former   Types: Cigarettes  Smokeless Tobacco Never        Social History  Social History         Socioeconomic History   Marital status: Single  Tobacco Use    Smoking status: Former      Types: Cigarettes   Smokeless tobacco: Never  Vaping Use   Vaping status: Never Used  Substance and Sexual Activity   Alcohol use: Not Currently   Drug use: Never        Objective:  There were no vitals filed for this visit.  There is no height or weight on file to calculate BMI.   Physical Exam Constitutional:      Appearance: Normal appearance.  HENT:     Head: Normocephalic and atraumatic.     Mouth/Throat:     Mouth: Mucous membranes are moist.     Pharynx: Oropharynx is clear.  Eyes:     General: No scleral icterus.    Pupils: Pupils are equal, round, and reactive to light.  Cardiovascular:     Rate and Rhythm: Normal rate and regular rhythm.     Pulses: Normal pulses.     Heart sounds: No murmur heard.    No friction rub. No gallop.  Pulmonary:     Effort: Pulmonary effort is normal. No respiratory distress.     Breath sounds: Normal breath sounds. No stridor.  Abdominal:     General: Abdomen is flat.  Musculoskeletal:        General: No swelling.  Skin:    General: Skin is warm.  Neurological:     General: No focal deficit present.     Mental Status: She is alert and oriented to person, place, and time. Mental status is at baseline.  Psychiatric:        Mood and Affect: Mood normal.        Thought Content: Thought content normal.        Judgment: Judgment normal.        Assessment and Plan:    Assessment Diagnoses and all orders for this visit:   Thyroid nodule     1.  Will proceed to the operating for a right thyroid lobectomy. 2.  I discussed with her the risk and benefits of the procedure to include but not limited to: Infection, bleeding, damage to surrounding structures to include the parathyroid glands and the recurrent laryngeal nerves, and the possible need for further surgery.  Patient voiced understanding and wishes to proceed.       Axel Filler, MD

## 2023-03-14 NOTE — H&P (Signed)
Chief Complaint: New Consultation (THYROID NODULE)   History of Present Illness: Barbara Douglas is a 57 y.o. female who is seen today as an office consultation for evaluation of New Consultation (THYROID NODULE)     Patient is a 57 year old female, comes in with history of cirrhosis, COPD, obesity, and a right thyroid nodule. Patient states that she was undergoing carotid ultrasound and was found to have a right thyroid nodule.  She subsequently underwent formal ultrasound of her thyroid.  Patient was found to have a 1.7 cm right inferior thyroid nodule.  This was sent for biopsy.  This showed a Bethesda 4 category nodule, follicular type as well as apartment with approximately 50% chance of cancer.   Patient was sent for evaluation and management.   Discussed with the patient she has not had any other symptoms of dysphagia or respiratory distress.   She does state that she previously was taking Ozempic.  She has stopped that.     Review of Systems: A complete review of systems was obtained from the patient.  I have reviewed this information and discussed as appropriate with the patient.  See HPI as well for other ROS.   Review of Systems  Constitutional:  Negative for fever.  HENT:  Negative for congestion.   Eyes:  Negative for blurred vision.  Respiratory:  Negative for cough, shortness of breath and wheezing.   Cardiovascular:  Negative for chest pain and palpitations.  Gastrointestinal:  Negative for heartburn.  Genitourinary:  Negative for dysuria.  Musculoskeletal:  Negative for myalgias.  Skin:  Negative for rash.  Neurological:  Negative for dizziness and headaches.  Psychiatric/Behavioral:  Negative for depression and suicidal ideas.   All other systems reviewed and are negative.     Medical History: Past Medical History      Past Medical History:  Diagnosis Date   Anxiety     Arthritis     Asthma, unspecified asthma severity, unspecified whether complicated,  unspecified whether persistent (HHS-HCC)     Diabetes mellitus without complication (CMS/HHS-HCC)     Glaucoma (increased eye pressure)     Hypertension     Liver disease     Sleep apnea           Problem List  There is no problem list on file for this patient.      Past Surgical History  History reviewed. No pertinent surgical history.      Allergies  No Known Allergies     Medications Ordered Prior to Encounter        Current Outpatient Medications on File Prior to Visit  Medication Sig Dispense Refill   albuterol MDI, PROVENTIL, VENTOLIN, PROAIR, HFA 90 mcg/actuation inhaler Inhale into the lungs at bedtime as needed       ARNUITY ELLIPTA 100 mcg/actuation DsDv Inhale into the lungs       clonazePAM (KLONOPIN) 1 MG tablet Take 1 mg by mouth at bedtime       escitalopram oxalate (LEXAPRO) 10 MG tablet TAKE 0.5 TABLET ONCE DAILY FOR 1 WEEK AND THEN TAKE 1 TABLET ONCE DAILY       gabapentin (NEURONTIN) 300 MG capsule Take 1 capsule by mouth 3 (three) times daily       hydroCHLOROthiazide (HYDRODIURIL) 12.5 MG tablet take 1 tablet by mouth every day in the morning for 90 days       JANUVIA 100 mg tablet Take 100 mg by mouth once daily       lisinopriL-hydroCHLOROthiazide (  ZESTORETIC) 10-12.5 mg tablet Take 1 tablet by mouth once daily       traZODone (DESYREL) 150 MG tablet Take 2 tablets by mouth at bedtime        No current facility-administered medications on file prior to visit.        Family History       Family History  Problem Relation Age of Onset   High blood pressure (Hypertension) Mother     Hyperlipidemia (Elevated cholesterol) Mother     Coronary Artery Disease (Blocked arteries around heart) Mother          Tobacco Use History  Social History        Tobacco Use  Smoking Status Former   Types: Cigarettes  Smokeless Tobacco Never        Social History  Social History         Socioeconomic History   Marital status: Single  Tobacco Use    Smoking status: Former      Types: Cigarettes   Smokeless tobacco: Never  Vaping Use   Vaping status: Never Used  Substance and Sexual Activity   Alcohol use: Not Currently   Drug use: Never        Objective:  There were no vitals filed for this visit.  There is no height or weight on file to calculate BMI.   Physical Exam Constitutional:      Appearance: Normal appearance.  HENT:     Head: Normocephalic and atraumatic.     Mouth/Throat:     Mouth: Mucous membranes are moist.     Pharynx: Oropharynx is clear.  Eyes:     General: No scleral icterus.    Pupils: Pupils are equal, round, and reactive to light.  Cardiovascular:     Rate and Rhythm: Normal rate and regular rhythm.     Pulses: Normal pulses.     Heart sounds: No murmur heard.    No friction rub. No gallop.  Pulmonary:     Effort: Pulmonary effort is normal. No respiratory distress.     Breath sounds: Normal breath sounds. No stridor.  Abdominal:     General: Abdomen is flat.  Musculoskeletal:        General: No swelling.  Skin:    General: Skin is warm.  Neurological:     General: No focal deficit present.     Mental Status: She is alert and oriented to person, place, and time. Mental status is at baseline.  Psychiatric:        Mood and Affect: Mood normal.        Thought Content: Thought content normal.        Judgment: Judgment normal.        Assessment and Plan:    Assessment Diagnoses and all orders for this visit:   Thyroid nodule     1.  Will proceed to the operating for a right thyroid lobectomy. 2.  I discussed with her the risk and benefits of the procedure to include but not limited to: Infection, bleeding, damage to surrounding structures to include the parathyroid glands and the recurrent laryngeal nerves, and the possible need for further surgery.  Patient voiced understanding and wishes to proceed.       Axel Filler, MD

## 2023-03-18 DIAGNOSIS — F331 Major depressive disorder, recurrent, moderate: Secondary | ICD-10-CM | POA: Diagnosis not present

## 2023-03-18 DIAGNOSIS — F4312 Post-traumatic stress disorder, chronic: Secondary | ICD-10-CM | POA: Diagnosis not present

## 2023-03-18 DIAGNOSIS — F411 Generalized anxiety disorder: Secondary | ICD-10-CM | POA: Diagnosis not present

## 2023-03-21 NOTE — Pre-Procedure Instructions (Signed)
Surgical Instructions   Your procedure is scheduled on April 08, 2023. Report to [facility] Main Entrance "A" at 8:30 A.M., then check in with the Admitting office. Any questions or running late day of surgery: call [phone]  Questions prior to your surgery date: call [phone], Monday-Friday, 8am-4pm. If you experience any cold or flu symptoms such as cough, fever, chills, shortness of breath, etc. between now and your scheduled surgery, please notify us at the above number.     Remember:  Do not eat after midnight the night before your surgery   You may drink clear liquids until 7:30 AM the morning of your surgery.   Clear liquids allowed are: Water, Non-Citrus Juices (without pulp), Carbonated Beverages, Clear Tea, Black Coffee Only (NO MILK, CREAM OR POWDERED CREAMER of any kind), and Gatorade.  Patient Instructions  The night before surgery:  No food after midnight. ONLY clear liquids after midnight  The day of surgery (if you have diabetes): Drink ONE (1) 12 oz G2 given to you in your pre admission testing appointment by 7:30 AM the morning of surgery. Drink in one sitting. Do not sip.  This drink was given to you during your hospital  pre-op appointment visit.  Nothing else to drink after completing the  12 oz bottle of G2.         If you have questions, please contact your surgeon's office.     Take these medicines the morning of surgery with A SIP OF WATER: gabapentin (NEURONTIN)  XIFAXAN    May take these medicines IF NEEDED: albuterol (PROVENTIL HFA;VENTOLIN HFA) inhaler  clonazePAM (KLONOPIN)  fluticasone (FLOVENT DISKUS) diskus inhaler    One week prior to surgery, STOP taking any Aspirin (unless otherwise instructed by your surgeon) Aleve, Naproxen, Ibuprofen, Motrin, Advil, Goody's, BC's, all herbal medications, fish oil, and non-prescription vitamins.   WHAT DO I DO ABOUT MY DIABETES MEDICATION?   Do not take TRADJENTA the morning of  surgery.  STOP taking dapagliflozin propanediol (FARXIGA) three days prior to surgery. Your last dose will be August 30th.      HOW TO MANAGE YOUR DIABETES BEFORE AND AFTER SURGERY  Why is it important to control my blood sugar before and after surgery? Improving blood sugar levels before and after surgery helps healing and can limit problems. A way of improving blood sugar control is eating a healthy diet by:  Eating less sugar and carbohydrates  Increasing activity/exercise  Talking with your doctor about reaching your blood sugar goals High blood sugars (greater than 180 mg/dL) can raise your risk of infections and slow your recovery, so you will need to focus on controlling your diabetes during the weeks before surgery. Make sure that the doctor who takes care of your diabetes knows about your planned surgery including the date and location.  How do I manage my blood sugar before surgery? Check your blood sugar at least 4 times a day, starting 2 days before surgery, to make sure that the level is not too high or low.  Check your blood sugar the morning of your surgery when you wake up and every 2 hours until you get to the Short Stay unit.  If your blood sugar is less than 70 mg/dL, you will need to treat for low blood sugar: Do not take insulin. Treat a low blood sugar (less than 70 mg/dL) with  cup of clear juice (cranberry or apple), 4 glucose tablets, OR glucose gel. Recheck blood sugar in 15 minutes  after treatment (to make sure it is greater than 70 mg/dL). If your blood sugar is not greater than 70 mg/dL on recheck, call [phone] for further instructions. Report your blood sugar to the short stay nurse when you get to Short Stay.  If you are admitted to the hospital after surgery: Your blood sugar will be checked by the staff and you will probably be given insulin after surgery (instead of oral diabetes medicines) to make sure you have good blood sugar levels. The goal  for blood sugar control after surgery is 80-180 mg/dL.                      Do NOT Smoke (Tobacco/Vaping) for 24 hours prior to your procedure.  If you use a CPAP at night, you may bring your mask/headgear for your overnight stay.   You will be asked to remove any contacts, glasses, piercing's, hearing aid's, dentures/partials prior to surgery. Please bring cases for these items if needed.    Patients discharged the day of surgery will not be allowed to drive home, and someone needs to stay with them for 24 hours.  SURGICAL WAITING ROOM VISITATION Patients may have no more than 2 support people in the waiting area - these visitors may rotate.   Pre-op nurse will coordinate an appropriate time for 1 ADULT support person, who may not rotate, to accompany patient in pre-op.  Children under the age of 91 must have an adult with them who is not the patient and must remain in the main waiting area with an adult.  If the patient needs to stay at the hospital during part of their recovery, the visitor guidelines for inpatient rooms apply.  Please refer to the [facility] website for the visitor guidelines for any additional information.   If you received a COVID test during your pre-op visit  it is requested that you wear a mask when out in public, stay away from anyone that may not be feeling well and notify your surgeon if you develop symptoms. If you have been in contact with anyone that has tested positive in the last 10 days please notify you surgeon.      Pre-operative CHG Bathing Instructions   You can play a key role in reducing the risk of infection after surgery. Your skin needs to be as free of germs as possible. You can reduce the number of germs on your skin by washing with CHG (chlorhexidine gluconate) soap before surgery. CHG is an antiseptic soap that kills germs and continues to kill germs even after washing.   DO NOT use if you have an allergy to chlorhexidine/CHG or  antibacterial soaps. If your skin becomes reddened or irritated, stop using the CHG and notify one of our RNs at [phone].              TAKE A SHOWER THE NIGHT BEFORE SURGERY AND THE DAY OF SURGERY    Please keep in mind the following:  DO NOT shave, including legs and underarms, 48 hours prior to surgery.   You may shave your face before/day of surgery.  Place clean sheets on your bed the night before surgery Use a clean washcloth (not used since being washed) for each shower. DO NOT sleep with pet's night before surgery.  CHG Shower Instructions:  If you choose to wash your hair and private area, wash first with your normal shampoo/soap.  After you use shampoo/soap, rinse your hair and body  thoroughly to remove shampoo/soap residue.  Turn the water OFF and apply half the bottle of CHG soap to a CLEAN washcloth.  Apply CHG soap ONLY FROM YOUR NECK DOWN TO YOUR TOES (washing for 3-5 minutes)  DO NOT use CHG soap on face, private areas, open wounds, or sores.  Pay special attention to the area where your surgery is being performed.  If you are having back surgery, having someone wash your back for you may be helpful. Wait 2 minutes after CHG soap is applied, then you may rinse off the CHG soap.  Pat dry with a clean towel  Put on clean pajamas    Additional instructions for the day of surgery: DO NOT APPLY any lotions, deodorants, cologne, or perfumes.   Do not wear jewelry or makeup Do not wear nail polish, gel polish, artificial nails, or any other type of covering on natural nails (fingers and toes) Do not bring valuables to the hospital. [facility] is not responsible for valuables/personal belongings. Put on clean/comfortable clothes.  Please brush your teeth.  Ask your nurse before applying any prescription medications to the skin.

## 2023-03-24 ENCOUNTER — Encounter (HOSPITAL_COMMUNITY)
Admission: RE | Admit: 2023-03-24 | Discharge: 2023-03-24 | Disposition: A | Discharge status: Home / Self Care | Payer: BC Managed Care – PPO | Source: Ambulatory Visit | Attending: General Surgery | Admitting: General Surgery

## 2023-03-24 ENCOUNTER — Other Ambulatory Visit: Payer: Self-pay

## 2023-03-24 ENCOUNTER — Encounter (HOSPITAL_COMMUNITY): Payer: Self-pay

## 2023-03-24 VITALS — BP 138/71 | HR 78 | Temp 98.2°F | Resp 18 | Ht 64.0 in | Wt 298.4 lb

## 2023-03-24 DIAGNOSIS — E119 Type 2 diabetes mellitus without complications: Secondary | ICD-10-CM | POA: Insufficient documentation

## 2023-03-24 DIAGNOSIS — Z01812 Encounter for preprocedural laboratory examination: Secondary | ICD-10-CM | POA: Insufficient documentation

## 2023-03-24 DIAGNOSIS — Z01810 Encounter for preprocedural cardiovascular examination: Secondary | ICD-10-CM | POA: Diagnosis not present

## 2023-03-24 DIAGNOSIS — Z01818 Encounter for other preprocedural examination: Secondary | ICD-10-CM | POA: Diagnosis not present

## 2023-03-24 DIAGNOSIS — R9431 Abnormal electrocardiogram [ECG] [EKG]: Secondary | ICD-10-CM | POA: Insufficient documentation

## 2023-03-24 DIAGNOSIS — K769 Liver disease, unspecified: Secondary | ICD-10-CM | POA: Insufficient documentation

## 2023-03-24 HISTORY — DX: Unspecified asthma, uncomplicated: J45.909

## 2023-03-24 HISTORY — DX: Chronic obstructive pulmonary disease, unspecified: J44.9

## 2023-03-24 HISTORY — DX: Unspecified osteoarthritis, unspecified site: M19.90

## 2023-03-24 HISTORY — DX: Anxiety disorder, unspecified: F41.9

## 2023-03-24 HISTORY — DX: Inflammatory liver disease, unspecified: K75.9

## 2023-03-24 LAB — COMPREHENSIVE METABOLIC PANEL WITH GFR
ALT: 28 U/L (ref 0–44)
AST: 37 U/L (ref 15–41)
Albumin: 3.5 g/dL (ref 3.5–5.0)
Alkaline Phosphatase: 75 U/L (ref 38–126)
Anion gap: 9 (ref 5–15)
BUN: 12 mg/dL (ref 6–20)
CO2: 26 mmol/L (ref 22–32)
Calcium: 9.1 mg/dL (ref 8.9–10.3)
Chloride: 99 mmol/L (ref 98–111)
Creatinine, Ser: 0.94 mg/dL (ref 0.44–1.00)
GFR, Estimated: >60 mL/min
Glucose, Bld: 204 mg/dL — ABNORMAL HIGH (ref 70–99)
Potassium: 3.9 mmol/L (ref 3.5–5.1)
Sodium: 134 mmol/L — ABNORMAL LOW (ref 135–145)
Total Bilirubin: 0.7 mg/dL (ref 0.3–1.2)
Total Protein: 7.5 g/dL (ref 6.5–8.1)

## 2023-03-24 LAB — GLUCOSE, CAPILLARY: Glucose-Capillary: 223 mg/dL — ABNORMAL HIGH (ref 70–99)

## 2023-03-24 LAB — HEMOGLOBIN A1C
Hgb A1c MFr Bld: 7.2 % — ABNORMAL HIGH (ref 4.8–5.6)
Mean Plasma Glucose: 159.94 mg/dL

## 2023-03-24 LAB — CBC
HCT: 43.4 % (ref 36.0–46.0)
Hemoglobin: 14.5 g/dL (ref 12.0–15.0)
MCH: 29.3 pg (ref 26.0–34.0)
MCHC: 33.4 g/dL (ref 30.0–36.0)
MCV: 87.7 fL (ref 80.0–100.0)
Platelets: 190 K/uL (ref 150–400)
RBC: 4.95 MIL/uL (ref 3.87–5.11)
RDW: 13 % (ref 11.5–15.5)
WBC: 6 K/uL (ref 4.0–10.5)
nRBC: 0 % (ref 0.0–0.2)

## 2023-03-24 NOTE — Progress Notes (Signed)
PCP - Dr. Ardean Larsen Cardiologist - Dr. Genella Rife [facility] Gastroenterologist - Dr. Redgie Grayer - MD retired and she is working on getting in with Dr. Particia Lather (manages Liver Cirrhosis)  PPM/ICD - Denies Device Orders - n/a Rep Notified - n/a  Chest x-ray - Denies EKG - 03/24/2023 Stress Test - 04/23/2021 ECHO - 03/05/2021 Cardiac Cath - Denies  Sleep Study - +OSA. Pt wears CPAP nightly. Automatic pressure adjustment  Pt is DM2. She checks her blood sugars 3-4x/day. Normal fasting range before MD stopped her Ozempic was 95-110. Pt is now on Januvia, which is not controlling her blood sugars well at all, and her fasting blood sugars are 175-225. CBG at pre-op 223 with nothing to eat or drink today. A1c result pending. Discussed with Antionette Poles, PA-C   Last dose of GLP1 agonist- Last dose of Ozempic 03/05/23 GLP1 instructions: n/a. Pt no longer on it.  Blood Thinner Instructions: n/a Aspirin Instructions: n/a  ERAS Protcol - Clear liquids until 0730 morning of surgery PRE-SURGERY Ensure or G2- G2 given to pt with instructions  COVID TEST- n/a   Anesthesia review: Yes. Hx of HTN, DM2, Carotid Stenosis, Liver Cirrhosis and OSA on CPAP. All discussed with Antionette Poles, PA-C   Patient denies shortness of breath, fever, cough and chest pain at PAT appointment. Pt denies any respiratory illness/infection in the last two months. Pt does endorse that her voice is hoarse as of Wednesday, August 14th. No prominent hoarseness heard by RN. Pt instructed to call us if the hoarseness gets worse and/or she develops additional symptoms like fever, cough, etc.   All instructions explained to the patient, with a verbal understanding of the material. Patient agrees to go over the instructions while at home for a better understanding. Patient also instructed to self quarantine after being tested for COVID-19. The opportunity to ask questions was provided.

## 2023-03-25 DIAGNOSIS — F332 Major depressive disorder, recurrent severe without psychotic features: Secondary | ICD-10-CM | POA: Diagnosis not present

## 2023-03-25 DIAGNOSIS — F4312 Post-traumatic stress disorder, chronic: Secondary | ICD-10-CM | POA: Diagnosis not present

## 2023-03-25 NOTE — Progress Notes (Signed)
Anesthesia Chart Review:  Case: [id number] Date/Time: 04/08/23 1015   Procedure: RIGHT THYROID LOBECTOMY (Right)   Anesthesia type: General   Pre-op diagnosis: RIGHT THYROID NODULE   Location: [facility] OR ROOM 10 / [facility] OR   Surgeons: Axel Filler, MD       DISCUSSION: Patient is a 57 year old female scheduled for the above procedure.  History includes former smoker (quit 11/04/07), HTN, HLD, DM2, liver cirrhosis, IBS, OSA (uses CPAP), asthma, COPD, carotid artery disease (60-79% BICA), depression. 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 BMI is consistent with morbid obesity. By GI notes, she has a family history of amyloidosis (sister).   Last visit with cardiologist Dr. Sharol Roussel was on 01/15/23. He initially saw her on 01/31/21 for DOE evaluation with elevated BNP and was started on Lasix. She also had underlying HTN, OSA, morbid obesity, dyslipidemia, and alcoholism (sober for 10 years) with cirrhosis. She subsequently had an echo on 03/05/21 and nuclear stress test on 04/23/21. Echo showed normal LV size, borderline LVH, LVEF 55-60%, normal LV filling pattern, grossly normal RV size and function, no significant valvular disease. Stress test was non-ischemic, EF 82%. At last visit she reported stable mild chronic dyspnea, no chest pain, orthopnea, PND, or syncope. One year follow-up planned. He did, however, order a carotid US due to bruit and was noted to have bilateral 60-79% ICA stenosis. 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   03/24/23 EKG was interpreted as NSR, RAD, low voltage QRS, non-specific ST abnormality; However, it appears that the arm leads are reversed. I requested copy of 01/31/21 EKG tracing from [facility]. Could consider repeating EKG on the day of  surgery if felt indicated since limb leads appear reversed. She did have recent cardiology follow-up as above.   Last office visit with GI Dr. Merri Brunette was on 05/29/22 for follow-up history of alcohol induced cirrhosis previously complicated by ascites and hepatic encephalopathy which has resolved.  Also with DM2 with gastroparesis, cholelithiasis on [place], and history of adenomatous polyp on prior colonoscopy. 08/28/21 EGD showed no esophageal varices. LFTs normal. Previous mild thrombocytopenia. Cirrhosis felt stable. Screening abd [place] on 07/26/22 showed cirrhotic morphology of the liver, cholelithiasis without cholecystitis, and trace perihepatic ascites.   03/24/23 labs showed glucose 204 with A1c 7.2% (down from 7.7% on 06/20/22), Creatinine 0.94, AST 37, ALT 28, normal CBC with PLT count of 190. PT/INR 10.0/0.95 on 05/29/22 ([facility]).   Last Ozempic 03/05/23. She is currently on Januvia 100 mg daily and noted home CBGs have been higher since Ozempic on hold, as fasting CBGs have been up to 225 at time. Her A1c was 7.2%.   Anesthesia team to evaluate on the day of surgery. She will get CBG on arrival. As above, defer decision regarding repeat EKG to anesthesiologist.    VS: 03/24/23: WT  135.4 kg, BP 138/71, HR 78   PROVIDERS: Verlon Au, MD is PCP  Therisa Doyne, MD is cardiologist Dierdre Highman, MD is GI, recently retired and working on getting established with new GI   LABS: Most recent lab results in [facility] include: Lab Results  Component Value Date   WBC 6.0 03/24/2023   HGB 14.5 03/24/2023   HCT 43.4 03/24/2023   PLT 190 03/24/2023   GLUCOSE 204 (H) 03/24/2023  ALT 28 03/24/2023   AST 37 03/24/2023   NA 134 (L) 03/24/2023   K 3.9 03/24/2023   CL 99 03/24/2023   CREATININE 0.94 03/24/2023   BUN 12 03/24/2023   CO2 26 03/24/2023   HGBA1C 7.2 (H) 03/24/2023    OTHER: EGD 08/28/21 ([facility]): Impression  No esophageal varices  Recommendation  Continue current  medications  Screening EGD 5 years   PFTs 10/20/18: FVC 2.69 (77%), post 2.74 (79%). FEV1 2.24 (82%), 2.28 (84%). DLCO unc 21.59 (104%).    IMAGES: US Thyroid 02/03/23: IMPRESSION: 1. Nodule 3 (TI-RADS 4), measuring 1.7 cm, located in the inferior right thyroid lobe, meets criteria for FNA. 2. Nodules 2 and 4 meet criteria for imaging follow-up. Annual ultrasound surveillance is recommended until 5 years of stability is documented.  [place] Abd (limited) 07/26/22 ([facility]): 1.  Cirrhotic morphology of the liver.  2.  Cholelithiasis without evidence of acute cholecystitis.  3.  Trace perihepatic ascites.    EKG: See DISCUSSION. EKG 03/24/23: Normal sinus rhythm Right axis deviation Low voltage QRS Nonspecific ST abnormality Abnormal ECG No previous ECGs available Confirmed by Marca Ancona ([id number]) on 03/24/2023 11:59:52 AM - Appears arm limb leads may be reversed.    CV: US Carotid 01/21/23 ([facility]): Conclusion  Right 60-79% stenosis of the bifurcation to mid internal carotid artery. This is a hemodynamically significant stenosis. Elevated velocities present in the external carotid artery. The vertebral artery flow was antegrade and the subclavian artery was multiphasic. Incidentally noted a homogeneous appearing nodule measuring 1.7 cm x 1.1 cm and a cystic nodule within the thyroid gland. Further imaging may be warranted if clinically indicated.  Left  60-79% stenosis of the proximal internal carotid artery. This is a hemodynamically significant stenosis. The vertebral artery flow was antegrade and the subclavian artery was multiphasic. Incidentally noted multiple cystic nodules within the thyroid gland. Further imaging may be warranted if clinically indicated.    Nuclear stress test 04/23/21 ([facility]): IMPRESSION:  1. Normal nuclear stress test without evidence of fixed or inducible  defect  2. Calculated ejection fraction is hyperdynamic at 82%  3.  Normal nuclear stress  test without evidence of fixed or inducible  defect with a hyperdynamic LV function    Echo 03/05/21 ([facility]): SUMMARY  The left ventricular size is normal. There is borderline concentric  left ventricular hypertrophy. Left ventricular systolic function is  normal. LV ejection fraction = 55-60%.  Left ventricular filling pattern is normal. LAP is probably normal.  The right ventricle is not well visualized. Grossly normal RV size and function.  The atria are normal in size.  There is no obvious significant valvular stenosis or regurgitation.  There was insufficient TR detected to calculate RV systolic pressure.  The inferior vena cava was not visualized during the exam.    Past Medical History:  Diagnosis Date   Allergic rhinitis    Anxiety    Arthritis    Back and hands   Asthma    Carotid artery stenosis    60-79% bilateral ICA stenosis 01/21/23   Cirrhosis of liver (HCC)    COPD (chronic obstructive pulmonary disease) (HCC)    Depression    Diabetes mellitus without complication (HCC)    Type 2   Gastroparesis    Hepatitis    Liver Cirrhosis   Hyperlipidemia    Hypertension    Insomnia    Irritable bowel syndrome    Obesity    Obstructive sleep apnea  Sciatic nerve pain     Past Surgical History:  Procedure Laterality Date   COLONOSCOPY WITH ESOPHAGOGASTRODUODENOSCOPY (EGD)  08/2020   Uterine polyps     removed  12/2010   WISDOM TOOTH EXTRACTION      MEDICATIONS: No current facility-administered medications for this encounter.    albuterol (PROVENTIL HFA;VENTOLIN HFA) 108 (90 Base) MCG/ACT inhaler   ARNUITY ELLIPTA 100 MCG/ACT AEPB   ASHWAGANDHA PO   clonazePAM (KLONOPIN) 1 MG tablet   escitalopram (LEXAPRO) 10 MG tablet   gabapentin (NEURONTIN) 300 MG capsule   hydrochlorothiazide (HYDRODIURIL) 12.5 MG tablet   ibuprofen (ADVIL) 200 MG tablet   JANUVIA 100 MG tablet   lisinopril (PRINIVIL,ZESTRIL) 10 MG tablet   Melatonin 10 MG TABS   traZODone  (DESYREL) 150 MG tablet    Shonna Chock, PA-C Surgical Short Stay/Anesthesiology [facility] Phone [phone] [facility] Phone [phone] 03/26/2023 9:53 AM

## 2023-03-26 ENCOUNTER — Encounter (HOSPITAL_COMMUNITY): Payer: Self-pay | Admitting: General Surgery

## 2023-03-26 NOTE — Anesthesia Preprocedure Evaluation (Addendum)
Anesthesia Evaluation  Patient identified by MRN, date of birth, ID band Patient awake    Reviewed: Allergy & Precautions, NPO status , Patient's Chart, lab work & pertinent test results  Airway Mallampati: II  TM Distance: >3 FB Neck ROM: Full    Dental  (+) Edentulous Upper, Edentulous Lower   Pulmonary asthma , sleep apnea and Continuous Positive Airway Pressure Ventilation , COPD,  COPD inhaler, former smoker   Pulmonary exam normal        Cardiovascular hypertension, Pt. on medications Normal cardiovascular exam  01/2023 Carotid US: 60-79% Bilateral ICA stenosis  Nuclear stress test 04/23/21 ([facility]): 1.Normal nuclear stress test without evidence of fixed or inducible defect  2.Calculated ejection fraction is hyperdynamic at 82%  3. Normal nuclear stress test without evidence of fixed or inducible defect with a hyperdynamic LV function    Echo 03/05/21 ([facility]): The left ventricular size is normal. There is borderlineconcentric left ventricular hypertrophy. Left ventricular systolic function is normal. LV ejection fraction 55-60%.  Left ventricular filling pattern is normal. LAP is probably normal.  The right ventricle is not well visualized. Grossly normal RV size and function.  The atria are normal in size.  There is no obvious significant valvular stenosis or regurgitation.  There was insufficient TR detected to calculate RV systolic pressure.  The inferior vena cava was not visualized during the exam.       Neuro/Psych  PSYCHIATRIC DISORDERS Anxiety Depression     Neuromuscular disease    GI/Hepatic ,,,(+) Cirrhosis     substance abuse  alcohol use, Hepatitis -GASTROPARESIS   Endo/Other  diabetes  Morbid obesity  Renal/GU negative Renal ROS     Musculoskeletal  (+) Arthritis ,    Abdominal  (+) + obese  Peds  Hematology negative hematology ROS (+)   Anesthesia Other Findings RIGHT THYROID  NODULE  Reproductive/Obstetrics                             Anesthesia Physical Anesthesia Plan  ASA: 3  Anesthesia Plan: General   Post-op Pain Management:    Induction: Intravenous  PONV Risk Score and Plan: 3 and Ondansetron, Dexamethasone, Midazolam and Treatment may vary due to age or medical condition  Airway Management Planned: Oral ETT and Video Laryngoscope Planned  Additional Equipment:   Intra-op Plan:   Post-operative Plan: Extubation in OR  Informed Consent: I have reviewed the patients History and Physical, chart, labs and discussed the procedure including the risks, benefits and alternatives for the proposed anesthesia with the patient or authorized representative who has indicated his/her understanding and acceptance.     Dental advisory given  Plan Discussed with: CRNA  Anesthesia Plan Comments: (PAT note written 03/26/2023 by Shonna Chock, PA-C. 03/24/23 EKG appears to have reversed limb leads, defer decision to repeat on the day of surgery to anesthesiologist.  . )       Anesthesia Quick Evaluation

## 2023-04-01 DIAGNOSIS — F331 Major depressive disorder, recurrent, moderate: Secondary | ICD-10-CM | POA: Diagnosis not present

## 2023-04-01 DIAGNOSIS — F411 Generalized anxiety disorder: Secondary | ICD-10-CM | POA: Diagnosis not present

## 2023-04-01 DIAGNOSIS — F4312 Post-traumatic stress disorder, chronic: Secondary | ICD-10-CM | POA: Diagnosis not present

## 2023-04-08 ENCOUNTER — Other Ambulatory Visit: Payer: Self-pay

## 2023-04-08 ENCOUNTER — Ambulatory Visit (HOSPITAL_COMMUNITY): Payer: BC Managed Care – PPO | Admitting: Vascular Surgery

## 2023-04-08 ENCOUNTER — Encounter (HOSPITAL_COMMUNITY)
Admission: RE | Disposition: A | Discharge status: Home / Self Care | Payer: Self-pay | Source: Ambulatory Visit | Attending: General Surgery

## 2023-04-08 ENCOUNTER — Observation Stay (HOSPITAL_COMMUNITY)
Admission: RE | Admit: 2023-04-08 | Discharge: 2023-04-09 | Disposition: A | Discharge status: Home / Self Care | Payer: BC Managed Care – PPO | Source: Ambulatory Visit | Attending: General Surgery | Admitting: General Surgery

## 2023-04-08 ENCOUNTER — Encounter (HOSPITAL_COMMUNITY): Payer: Self-pay | Admitting: General Surgery

## 2023-04-08 DIAGNOSIS — Z7984 Long term (current) use of oral hypoglycemic drugs: Secondary | ICD-10-CM | POA: Diagnosis not present

## 2023-04-08 DIAGNOSIS — E119 Type 2 diabetes mellitus without complications: Secondary | ICD-10-CM | POA: Insufficient documentation

## 2023-04-08 DIAGNOSIS — Z79899 Other long term (current) drug therapy: Secondary | ICD-10-CM | POA: Insufficient documentation

## 2023-04-08 DIAGNOSIS — E890 Postprocedural hypothyroidism: Principal | ICD-10-CM

## 2023-04-08 DIAGNOSIS — I10 Essential (primary) hypertension: Secondary | ICD-10-CM | POA: Diagnosis not present

## 2023-04-08 DIAGNOSIS — J45909 Unspecified asthma, uncomplicated: Secondary | ICD-10-CM | POA: Diagnosis not present

## 2023-04-08 DIAGNOSIS — Z87891 Personal history of nicotine dependence: Secondary | ICD-10-CM | POA: Insufficient documentation

## 2023-04-08 DIAGNOSIS — E041 Nontoxic single thyroid nodule: Secondary | ICD-10-CM | POA: Diagnosis not present

## 2023-04-08 DIAGNOSIS — Z23 Encounter for immunization: Secondary | ICD-10-CM | POA: Insufficient documentation

## 2023-04-08 DIAGNOSIS — D34 Benign neoplasm of thyroid gland: Secondary | ICD-10-CM | POA: Diagnosis not present

## 2023-04-08 DIAGNOSIS — J449 Chronic obstructive pulmonary disease, unspecified: Secondary | ICD-10-CM | POA: Diagnosis not present

## 2023-04-08 DIAGNOSIS — Z9089 Acquired absence of other organs: Secondary | ICD-10-CM

## 2023-04-08 HISTORY — PX: THYROID LOBECTOMY: SHX420

## 2023-04-08 HISTORY — DX: Occlusion and stenosis of unspecified carotid artery: I65.29

## 2023-04-08 LAB — GLUCOSE, CAPILLARY
Glucose-Capillary: 201 mg/dL — ABNORMAL HIGH (ref 70–99)
Glucose-Capillary: 145 mg/dL — ABNORMAL HIGH (ref 70–99)
Glucose-Capillary: 196 mg/dL — ABNORMAL HIGH (ref 70–99)
Glucose-Capillary: 257 mg/dL — ABNORMAL HIGH (ref 70–99)

## 2023-04-08 SURGERY — LOBECTOMY, THYROID
Anesthesia: General | Laterality: Right

## 2023-04-08 MED ORDER — 0.9 % SODIUM CHLORIDE (POUR BTL) OPTIME
TOPICAL | Status: DC | PRN
  Administered 2023-04-08: 1000 mL

## 2023-04-08 MED ORDER — ONDANSETRON 4 MG PO TBDP: 4.0000 mg | ORAL_TABLET | Freq: Four times a day (QID) | ORAL | Status: DC | PRN

## 2023-04-08 MED ORDER — ORAL CARE MOUTH RINSE: 15.0000 mL | Freq: Once | OROMUCOSAL | Status: AC

## 2023-04-08 MED ORDER — BUPIVACAINE HCL (PF) 0.25 % IJ SOLN
INTRAMUSCULAR | Status: AC
  Filled 2023-04-08: qty 30

## 2023-04-08 MED ORDER — ONDANSETRON HCL 4 MG/2ML IJ SOLN
INTRAMUSCULAR | Status: AC
  Filled 2023-04-08: qty 4

## 2023-04-08 MED ORDER — HEMOSTATIC AGENTS (NO CHARGE) OPTIME
TOPICAL | Status: DC | PRN
Start: 2023-04-08 — End: 2023-04-08
  Administered 2023-04-08: 1 via TOPICAL

## 2023-04-08 MED ORDER — CEFAZOLIN SODIUM-DEXTROSE 2-3 GM-%(50ML) IV SOLR
INTRAVENOUS | Status: DC | PRN
  Administered 2023-04-08: 3 g via INTRAVENOUS

## 2023-04-08 MED ORDER — MIDAZOLAM HCL 2 MG/2ML IJ SOLN
INTRAMUSCULAR | Status: AC
  Filled 2023-04-08: qty 2

## 2023-04-08 MED ORDER — OXYCODONE HCL 5 MG PO TABS
5.0000 mg | ORAL_TABLET | ORAL | Status: DC | PRN
  Administered 2023-04-08: 5 mg via ORAL
  Filled 2023-04-08: qty 1

## 2023-04-08 MED ORDER — PROPOFOL 500 MG/50ML IV EMUL
INTRAVENOUS | Status: DC | PRN
  Administered 2023-04-08: 50 ug/kg/min via INTRAVENOUS

## 2023-04-08 MED ORDER — INSULIN ASPART 100 UNIT/ML IJ SOLN: 0.0000 [IU] | INTRAMUSCULAR | Status: DC | PRN

## 2023-04-08 MED ORDER — ROCURONIUM BROMIDE 10 MG/ML (PF) SYRINGE
PREFILLED_SYRINGE | INTRAVENOUS | Status: DC | PRN
  Administered 2023-04-08: 50 mg via INTRAVENOUS

## 2023-04-08 MED ORDER — ESMOLOL HCL 100 MG/10ML IV SOLN
INTRAVENOUS | Status: AC
  Filled 2023-04-08: qty 10

## 2023-04-08 MED ORDER — ESCITALOPRAM OXALATE 10 MG PO TABS: 15.0000 mg | ORAL_TABLET | Freq: Every day | ORAL | Status: DC

## 2023-04-08 MED ORDER — CEFAZOLIN IN SODIUM CHLORIDE 3-0.9 GM/100ML-% IV SOLN
3.0000 g | INTRAVENOUS | Status: DC
  Filled 2023-04-08: qty 100

## 2023-04-08 MED ORDER — INSULIN ASPART 100 UNIT/ML IJ SOLN
INTRAMUSCULAR | Status: AC
  Administered 2023-04-08: 4 [IU] via SUBCUTANEOUS
  Filled 2023-04-08: qty 1

## 2023-04-08 MED ORDER — PHENYLEPHRINE 80 MCG/ML (10ML) SYRINGE FOR IV PUSH (FOR BLOOD PRESSURE SUPPORT)
PREFILLED_SYRINGE | INTRAVENOUS | Status: AC
  Filled 2023-04-08: qty 10

## 2023-04-08 MED ORDER — LIDOCAINE 2% (20 MG/ML) 5 ML SYRINGE
INTRAMUSCULAR | Status: AC
  Filled 2023-04-08: qty 10

## 2023-04-08 MED ORDER — ACETAMINOPHEN 500 MG PO TABS
1000.0000 mg | ORAL_TABLET | ORAL | Status: DC
  Filled 2023-04-08: qty 2

## 2023-04-08 MED ORDER — DEXTROSE-SODIUM CHLORIDE 5-0.9 % IV SOLN: INTRAVENOUS | Status: DC

## 2023-04-08 MED ORDER — SUCCINYLCHOLINE CHLORIDE 200 MG/10ML IV SOSY
PREFILLED_SYRINGE | INTRAVENOUS | Status: DC | PRN
  Administered 2023-04-08: 120 mg via INTRAVENOUS

## 2023-04-08 MED ORDER — LINAGLIPTIN 5 MG PO TABS
5.0000 mg | ORAL_TABLET | Freq: Every day | ORAL | Status: DC
  Filled 2023-04-08: qty 1

## 2023-04-08 MED ORDER — INFLUENZA VIRUS VACC SPLIT PF (FLUZONE) 0.5 ML IM SUSY
0.5000 mL | PREFILLED_SYRINGE | INTRAMUSCULAR | Status: AC
  Administered 2023-04-09: 0.5 mL via INTRAMUSCULAR
  Filled 2023-04-08: qty 0.5

## 2023-04-08 MED ORDER — HYDROMORPHONE HCL 1 MG/ML IJ SOLN
1.0000 mg | INTRAMUSCULAR | Status: DC | PRN
  Administered 2023-04-08 (×2): 1 mg via INTRAVENOUS
  Filled 2023-04-08 (×2): qty 1

## 2023-04-08 MED ORDER — ONDANSETRON HCL 4 MG/2ML IJ SOLN
INTRAMUSCULAR | Status: DC | PRN
  Administered 2023-04-08: 4 mg via INTRAVENOUS

## 2023-04-08 MED ORDER — ESMOLOL HCL 100 MG/10ML IV SOLN
INTRAVENOUS | Status: DC | PRN
Start: 2023-04-08 — End: 2023-04-08
  Administered 2023-04-08: 30 mg via INTRAVENOUS

## 2023-04-08 MED ORDER — ONDANSETRON HCL 4 MG/2ML IJ SOLN
4.0000 mg | Freq: Four times a day (QID) | INTRAMUSCULAR | Status: DC | PRN
  Administered 2023-04-08: 4 mg via INTRAVENOUS
  Filled 2023-04-08: qty 2

## 2023-04-08 MED ORDER — ALBUTEROL SULFATE (2.5 MG/3ML) 0.083% IN NEBU: 2.5000 mg | INHALATION_SOLUTION | Freq: Four times a day (QID) | RESPIRATORY_TRACT | Status: DC | PRN

## 2023-04-08 MED ORDER — GABAPENTIN 300 MG PO CAPS
600.0000 mg | ORAL_CAPSULE | Freq: Every day | ORAL | Status: DC
  Administered 2023-04-08: 600 mg via ORAL
  Filled 2023-04-08: qty 2

## 2023-04-08 MED ORDER — DEXAMETHASONE SODIUM PHOSPHATE 10 MG/ML IJ SOLN
INTRAMUSCULAR | Status: DC | PRN
  Administered 2023-04-08: 5 mg via INTRAVENOUS

## 2023-04-08 MED ORDER — HYDROCHLOROTHIAZIDE 12.5 MG PO TABS
12.5000 mg | ORAL_TABLET | Freq: Every morning | ORAL | Status: DC
  Administered 2023-04-08: 12.5 mg via ORAL
  Filled 2023-04-08: qty 1

## 2023-04-08 MED ORDER — CHLORHEXIDINE GLUCONATE CLOTH 2 % EX PADS: 6.0000 | MEDICATED_PAD | Freq: Once | CUTANEOUS | Status: DC

## 2023-04-08 MED ORDER — LISINOPRIL 10 MG PO TABS
10.0000 mg | ORAL_TABLET | Freq: Every day | ORAL | Status: DC
  Administered 2023-04-08: 10 mg via ORAL
  Filled 2023-04-08: qty 1

## 2023-04-08 MED ORDER — PROPOFOL 10 MG/ML IV BOLUS
INTRAVENOUS | Status: DC | PRN
Start: 2023-04-08 — End: 2023-04-08
  Administered 2023-04-08: 150 mg via INTRAVENOUS
  Administered 2023-04-08: 50 mg via INTRAVENOUS
  Administered 2023-04-08: 25 mg via INTRAVENOUS

## 2023-04-08 MED ORDER — FENTANYL CITRATE (PF) 250 MCG/5ML IJ SOLN
INTRAMUSCULAR | Status: DC | PRN
  Administered 2023-04-08: 150 ug via INTRAVENOUS
  Administered 2023-04-08 (×2): 50 ug via INTRAVENOUS

## 2023-04-08 MED ORDER — CHLORHEXIDINE GLUCONATE 0.12 % MT SOLN
15.0000 mL | Freq: Once | OROMUCOSAL | Status: AC
  Administered 2023-04-08: 15 mL via OROMUCOSAL
  Filled 2023-04-08: qty 15

## 2023-04-08 MED ORDER — MELATONIN 5 MG PO TABS
10.0000 mg | ORAL_TABLET | Freq: Every day | ORAL | Status: DC
  Administered 2023-04-08: 10 mg via ORAL
  Filled 2023-04-08: qty 2

## 2023-04-08 MED ORDER — FENTANYL CITRATE (PF) 250 MCG/5ML IJ SOLN
INTRAMUSCULAR | Status: AC
  Filled 2023-04-08: qty 5

## 2023-04-08 MED ORDER — CLONAZEPAM 0.5 MG PO TABS
1.0000 mg | ORAL_TABLET | Freq: Every day | ORAL | Status: DC
  Administered 2023-04-08: 1 mg via ORAL
  Filled 2023-04-08: qty 2

## 2023-04-08 MED ORDER — PHENYLEPHRINE 80 MCG/ML (10ML) SYRINGE FOR IV PUSH (FOR BLOOD PRESSURE SUPPORT)
PREFILLED_SYRINGE | INTRAVENOUS | Status: DC | PRN
  Administered 2023-04-08 (×3): 80 ug via INTRAVENOUS

## 2023-04-08 MED ORDER — TRAZODONE HCL 150 MG PO TABS
300.0000 mg | ORAL_TABLET | Freq: Every day | ORAL | Status: DC
  Administered 2023-04-08: 300 mg via ORAL
  Filled 2023-04-08: qty 2

## 2023-04-08 MED ORDER — LIDOCAINE 2% (20 MG/ML) 5 ML SYRINGE
INTRAMUSCULAR | Status: DC | PRN
  Administered 2023-04-08: 60 mg via INTRAVENOUS

## 2023-04-08 MED ORDER — ENSURE PRE-SURGERY PO LIQD: 296.0000 mL | Freq: Once | ORAL | Status: DC

## 2023-04-08 MED ORDER — BUPIVACAINE HCL 0.25 % IJ SOLN
INTRAMUSCULAR | Status: DC | PRN
  Administered 2023-04-08: 10 mL

## 2023-04-08 MED ORDER — DEXAMETHASONE SODIUM PHOSPHATE 10 MG/ML IJ SOLN
4.0000 mg | INTRAMUSCULAR | Status: DC
  Filled 2023-04-08: qty 1

## 2023-04-08 MED ORDER — DEXAMETHASONE SODIUM PHOSPHATE 10 MG/ML IJ SOLN
INTRAMUSCULAR | Status: AC
  Filled 2023-04-08: qty 1

## 2023-04-08 MED ORDER — PHENYLEPHRINE HCL-NACL 20-0.9 MG/250ML-% IV SOLN
INTRAVENOUS | Status: DC | PRN
  Administered 2023-04-08: 30 ug/min via INTRAVENOUS

## 2023-04-08 MED ORDER — LACTATED RINGERS IV SOLN: INTRAVENOUS | Status: DC

## 2023-04-08 MED ORDER — INSULIN ASPART 100 UNIT/ML IJ SOLN: 0.0000 [IU] | Freq: Three times a day (TID) | INTRAMUSCULAR | Status: DC

## 2023-04-08 SURGICAL SUPPLY — 50 items
ADH SKN CLS APL DERMABOND .7 (GAUZE/BANDAGES/DRESSINGS) ×1
APL PRP STRL LF DISP 70% ISPRP (MISCELLANEOUS) ×1
ATTRACTOMAT 16X20 MAGNETIC DRP (DRAPES) ×2 IMPLANT
BAG COUNTER SPONGE SURGICOUNT (BAG) ×2 IMPLANT
BAG SPNG CNTER NS LX DISP (BAG) ×1
BLADE CLIPPER SURG (BLADE) IMPLANT
CANISTER SUCT 3000ML PPV (MISCELLANEOUS) ×2 IMPLANT
CHLORAPREP W/TINT 26 (MISCELLANEOUS) ×2 IMPLANT
CLIP TI MEDIUM 24 (CLIP) ×2 IMPLANT
CLIP TI WIDE RED SMALL 24 (CLIP) ×2 IMPLANT
CNTNR URN SCR LID CUP LEK RST (MISCELLANEOUS) IMPLANT
CONT SPEC 4OZ STRL OR WHT (MISCELLANEOUS)
COVER SURGICAL LIGHT HANDLE (MISCELLANEOUS) ×2 IMPLANT
DERMABOND ADVANCED .7 DNX12 (GAUZE/BANDAGES/DRESSINGS) ×2 IMPLANT
DISSECTOR SURG LIGASURE 21 (MISCELLANEOUS) ×2 IMPLANT
DRAPE LAPAROTOMY 100X72 PEDS (DRAPES) ×2 IMPLANT
ELECT COATED BLADE 2.86 ST (ELECTRODE) ×2 IMPLANT
ELECT REM PT RETURN 9FT ADLT (ELECTROSURGICAL) ×1
ELECTRODE REM PT RTRN 9FT ADLT (ELECTROSURGICAL) ×2 IMPLANT
GAUZE 4X4 16PLY ~~LOC~~+RFID DBL (SPONGE) ×2 IMPLANT
GAUZE SPONGE 4X4 12PLY STRL (GAUZE/BANDAGES/DRESSINGS) ×2 IMPLANT
GLOVE BIO SURGEON STRL SZ7.5 (GLOVE) ×2 IMPLANT
GLOVE BIOGEL PI IND STRL 8 (GLOVE) ×2 IMPLANT
GOWN STRL REUS W/ TWL LRG LVL3 (GOWN DISPOSABLE) ×2 IMPLANT
GOWN STRL REUS W/ TWL XL LVL3 (GOWN DISPOSABLE) ×2 IMPLANT
GOWN STRL REUS W/TWL LRG LVL3 (GOWN DISPOSABLE) ×1
GOWN STRL REUS W/TWL XL LVL3 (GOWN DISPOSABLE) ×1
HEMOSTAT SURGICEL 2X4 FIBR (HEMOSTASIS) ×2 IMPLANT
ILLUMINATOR WAVEGUIDE N/F (MISCELLANEOUS) ×2 IMPLANT
KIT BASIN OR (CUSTOM PROCEDURE TRAY) ×2 IMPLANT
KIT TURNOVER KIT B (KITS) ×2 IMPLANT
NDL HYPO 25GX1X1/2 BEV (NEEDLE) ×2 IMPLANT
NEEDLE HYPO 25GX1X1/2 BEV (NEEDLE) ×1
NS IRRIG 1000ML POUR BTL (IV SOLUTION) ×2 IMPLANT
PACK GENERAL/GYN (CUSTOM PROCEDURE TRAY) ×2 IMPLANT
PAD ARMBOARD 7.5X6 YLW CONV (MISCELLANEOUS) ×2 IMPLANT
PENCIL SMOKE EVACUATOR (MISCELLANEOUS) ×2 IMPLANT
POSITIONER HEAD DONUT 9IN (MISCELLANEOUS) ×2 IMPLANT
SHEARS HARMONIC 9CM CVD (BLADE) IMPLANT
SPECIMEN JAR MEDIUM (MISCELLANEOUS) ×2 IMPLANT
SPONGE INTESTINAL PEANUT (DISPOSABLE) ×2 IMPLANT
SUT MNCRL AB 4-0 PS2 18 (SUTURE) ×2 IMPLANT
SUT SILK 2 0 (SUTURE) ×1
SUT SILK 2-0 18XBRD TIE 12 (SUTURE) ×2 IMPLANT
SUT SILK 3 0 (SUTURE) ×1
SUT SILK 3-0 18XBRD TIE 12 (SUTURE) ×2 IMPLANT
SUT VIC AB 3-0 SH 18 (SUTURE) ×2 IMPLANT
SYR CONTROL 10ML LL (SYRINGE) ×2 IMPLANT
TOWEL GREEN STERILE (TOWEL DISPOSABLE) ×2 IMPLANT
TOWEL GREEN STERILE FF (TOWEL DISPOSABLE) ×2 IMPLANT

## 2023-04-08 NOTE — Progress Notes (Signed)
   04/08/23 1557  TOC Brief Assessment  Insurance and Status Reviewed  Patient has primary care physician Yes  Home environment has been reviewed yes  Prior level of function: independent  Prior/Current Home Services No current home services  Social Determinants of Health Reivew SDOH reviewed no interventions necessary  Readmission risk has been reviewed Yes  Transition of care needs no transition of care needs at this time

## 2023-04-08 NOTE — Transfer of Care (Signed)
Immediate Anesthesia Transfer of Care Note  Patient: Barbara Douglas  Procedure(s) Performed: RIGHT THYROID LOBECTOMY (Right)  Patient Location: PACU  Anesthesia Type:General  Level of Consciousness: awake, alert , and oriented  Airway & Oxygen Therapy: Patient Spontanous Breathing  Post-op Assessment: Report given to RN and Post -op Vital signs reviewed and stable  Post vital signs: Reviewed and stable  Last Vitals:  Vitals Value Taken Time  BP 143/74 04/08/23 1205  Temp 98   Pulse 85 04/08/23 1209  Resp 16 04/08/23 1209  SpO2 93 % 04/08/23 1209  Vitals shown include unfiled device data.  Last Pain:  Vitals:   04/08/23 0949  TempSrc:   PainSc: 0-No pain         Complications: No notable events documented.

## 2023-04-08 NOTE — Op Note (Signed)
04/08/2023  11:43 AM  PATIENT:  Barbara Douglas  57 y.o. female  PRE-OPERATIVE DIAGNOSIS:  RIGHT THYROID NODULE  POST-OPERATIVE DIAGNOSIS:  RIGHT THYROID NODULE  PROCEDURE:  Procedure(s): RIGHT THYROID LOBECTOMY (Right)  SURGEON:  Surgeons and Role:    Axel Filler, MD - Primary  ASSISTANTS: Rockwell Germany, RNFA   ANESTHESIA:   local and general  EBL:  minimal   BLOOD ADMINISTERED:none  DRAINS: none   LOCAL MEDICATIONS USED:  BUPIVICAINE   SPECIMEN:  Source of Specimen:  right thyroid lobe with stitch in the superior lobe  DISPOSITION OF SPECIMEN:  PATHOLOGY  COUNTS:  YES  TOURNIQUET:  * No tourniquets in log *  DICTATION: .Dragon Dictation Complications: none   Counts: reported as correct x 2   Findings: Pt with inferior right thyroid nodule  Indications for procedure: The patient is a 44F who had a right inferior thyroid nodule.  Pt had a bethesda 4 nodule   Details of the procedure:    The patient was taken back to the operating room. The patient was placed in supine position with bilateral SCDs in place.  The patient was prepped and draped in the usual sterile fashion. After appropriate anitbiotics were confirmed, a time-out was confirmed and all facts were verified.   A 4 cm incision was made approximately 2 fingerbreadths above the sternal notch. Bovie cautery was used to maintain hemostasis dissection was carried down through the platysma. The platysma was elevated and flaps were created superiorly and inferiorly to the thyroid cartilage as well as the sternal notch, repsectively. The strap muscles were identified in the midline and separated. right-sided strap muscles were elevated off the anterior surface of the thyroid. This dissection was carried laterally. The middle thyroid vein was identified and doubly ligated. We proceeded to dissect away the superior lobe and Kitners were used to gently dissect the surrounding musculature from the thyroid. The  superior thyroid vessels were identified and doubly ligated with clips and transected with a Harmonic scalpel. At this time this freed up the superior lobe was able to deliver this into the wound.   We continued to dissect the thyroid off of the trachea from lateral to medial direction. The right recurrent laryngeal nerve was not identified, however dissection was kept right on the thyroid lobe.  The inferior thyroid vessels were identified ligated with clips. The right inferior parathyroid gland was seen and appeared normal in size and was protected.  At this time Berry's ligament was dissected away from the trachea. This delivered the right lobe of the thyroid into the wound and the harmonic scalpel was used to divide the thyroid in the midline. A superior stitch was then placed in the superior thyroid lobe.   The area was irrigated out. The dissection bed was hemostatic. We placed fibrillar hemostatic agent into the wound. Strap muscles were then reapproximated in the midline with interrupted 3-0 Vicryl stitches. The platysma was reapproximated using 3-0 Vicryl stitches in interrupted fashion. Skin was then reapproximated using a running subcuticular 4-0 Monocryl. The skin was then dressed with Dermabond. The patient was taken to the recovery room in stable condition.   PLAN OF CARE: Admit for overnight observation  PATIENT DISPOSITION:  PACU - hemodynamically stable.   Delay start of Pharmacological VTE agent (>24hrs) due to surgical blood loss or risk of bleeding: not applicable

## 2023-04-08 NOTE — Anesthesia Procedure Notes (Addendum)
Procedure Name: Intubation Date/Time: 04/08/2023 10:20 AM  Performed by: Darryl Nestle, CRNAPre-anesthesia Checklist: Patient identified, Emergency Drugs available, Suction available and Patient being monitored Patient Re-evaluated:Patient Re-evaluated prior to induction Oxygen Delivery Method: Circle system utilized Preoxygenation: Pre-oxygenation with 100% oxygen Induction Type: IV induction and Rapid sequence Laryngoscope Size: Mac and 3 Grade View: Grade I Tube type: Oral Tube size: 7.0 mm Number of attempts: 1 Airway Equipment and Method: Stylet and Oral airway Placement Confirmation: ETT inserted through vocal cords under direct vision, positive ETCO2 and breath sounds checked- equal and bilateral Secured at: 21 cm Tube secured with: Tape Dental Injury: Teeth and Oropharynx as per pre-operative assessment

## 2023-04-08 NOTE — Interval H&P Note (Signed)
History and Physical Interval Note:  04/08/2023 9:27 AM  Barbara Douglas  has presented today for surgery, with the diagnosis of RIGHT THYROID NODULE.  The various methods of treatment have been discussed with the patient and family. After consideration of risks, benefits and other options for treatment, the patient has consented to  Procedure(s): RIGHT THYROID LOBECTOMY (Right) as a surgical intervention.  The patient's history has been reviewed, patient examined, no change in status, stable for surgery.  I have reviewed the patient's chart and labs.  Questions were answered to the patient's satisfaction.     Axel Filler

## 2023-04-09 ENCOUNTER — Encounter (HOSPITAL_COMMUNITY): Payer: Self-pay | Admitting: General Surgery

## 2023-04-09 DIAGNOSIS — E119 Type 2 diabetes mellitus without complications: Secondary | ICD-10-CM | POA: Diagnosis not present

## 2023-04-09 DIAGNOSIS — Z23 Encounter for immunization: Secondary | ICD-10-CM | POA: Diagnosis not present

## 2023-04-09 DIAGNOSIS — Z7984 Long term (current) use of oral hypoglycemic drugs: Secondary | ICD-10-CM | POA: Diagnosis not present

## 2023-04-09 DIAGNOSIS — Z87891 Personal history of nicotine dependence: Secondary | ICD-10-CM | POA: Diagnosis not present

## 2023-04-09 DIAGNOSIS — I10 Essential (primary) hypertension: Secondary | ICD-10-CM | POA: Diagnosis not present

## 2023-04-09 DIAGNOSIS — Z79899 Other long term (current) drug therapy: Secondary | ICD-10-CM | POA: Diagnosis not present

## 2023-04-09 DIAGNOSIS — J449 Chronic obstructive pulmonary disease, unspecified: Secondary | ICD-10-CM | POA: Diagnosis not present

## 2023-04-09 DIAGNOSIS — E041 Nontoxic single thyroid nodule: Secondary | ICD-10-CM | POA: Diagnosis not present

## 2023-04-09 DIAGNOSIS — D34 Benign neoplasm of thyroid gland: Secondary | ICD-10-CM | POA: Diagnosis not present

## 2023-04-09 DIAGNOSIS — J45909 Unspecified asthma, uncomplicated: Secondary | ICD-10-CM | POA: Diagnosis not present

## 2023-04-09 MED ORDER — OXYCODONE HCL 5 MG PO TABS: 5.0000 mg | ORAL_TABLET | ORAL | 0 refills | Status: DC | PRN

## 2023-04-09 NOTE — Discharge Summary (Signed)
Physician Discharge Summary  Patient ID: Barbara Douglas MRN: [id number] DOB/AGE: 59-Sep-1967 60 y.o.  Admit date: 04/08/2023 Discharge date: 04/09/2023  Admission Diagnoses: s/p thyroid lobectomy for thyroid nodule  Discharge Diagnoses:  Principal Problem:   S/P thyroidectomy   Discharged Condition: good  Hospital Course: PT admitted post op.  She did well from pain standpoint.  She was tol PO well.  She had no issues while in the hosp.  She was deemed stable for DC and DC'd home.  Consults: None  Significant Diagnostic Studies: none  Treatments: surgery: as above  Discharge Exam: Blood pressure (!) 153/73, pulse 92, temperature 97.6 F (36.4 C), temperature source Oral, resp. rate 18, height 5\' 4"  (1.626 m), weight 130.6 kg, SpO2 95%. General appearance: alert and cooperative Neck: inc with superficial bruising  Disposition: Discharge disposition: 01-Home or Self Care       Discharge Instructions     Diet - low sodium heart healthy   Complete by: As directed    Increase activity slowly   Complete by: As directed       Allergies as of 04/09/2023       Reactions   Tramadol Hcl Other (See Comments)   Other Reaction(s): Delusions (intolerance), Psychosis, Unknown Tremors, chest pain Other reaction(s): Delusions (intolerance), Other (See Comments)  Tremors, chest pain Other reaction(s): Delusions (intolerance), Other (See Comments)  Tremors, chest pain  Tremors, chest pain  Hallucinations, voices.within a few days of taking the medication   Cortisone    NO ALLERGY PER PT   Tramadol Hcl    Acetaminophen Other (See Comments)   Patient states she "cannot take tylenol due to liver"        Medication List     TAKE these medications    albuterol 108 (90 Base) MCG/ACT inhaler Commonly known as: VENTOLIN HFA Inhale 2 puffs into the lungs every 6 (six) hours as needed for wheezing or shortness of breath.   Arnuity Ellipta 100 MCG/ACT Aepb Generic drug:  Fluticasone Furoate Inhale 1 puff into the lungs daily.   ASHWAGANDHA PO Take 2 capsules by mouth daily.   clonazePAM 1 MG tablet Commonly known as: KlonoPIN Take 1 tablet at bedtime as needed for sleep What changed:  how much to take how to take this when to take this additional instructions   escitalopram 10 MG tablet Commonly known as: LEXAPRO Take 15 mg by mouth daily.   gabapentin 300 MG capsule Commonly known as: NEURONTIN Take 600 mg by mouth at bedtime.   hydrochlorothiazide 12.5 MG tablet Commonly known as: HYDRODIURIL Take 12.5 mg by mouth in the morning.   ibuprofen 200 MG tablet Commonly known as: ADVIL Take 200 mg by mouth every 6 (six) hours as needed for fever, headache, mild pain, moderate pain or cramping.   Januvia 100 MG tablet Generic drug: sitaGLIPtin Take 100 mg by mouth daily.   lisinopril 10 MG tablet Commonly known as: ZESTRIL Take 10 mg by mouth daily.   Melatonin 10 MG Tabs Take 10 mg by mouth at bedtime.   oxyCODONE 5 MG immediate release tablet Commonly known as: Oxy IR/ROXICODONE Take 1 tablet (5 mg total) by mouth every 4 (four) hours as needed for moderate pain.   traZODone 150 MG tablet Commonly known as: DESYREL TAKE 2 TABLETS BY MOUTH AT BEDTIME        Follow-up Information     Axel Filler, MD. Schedule an appointment as soon as possible for a visit in 2 week(s).  Specialty: General Surgery Why: Post op visit Contact information: [address] [place] [address] [phone]                 Signed: Axel Filler 04/09/2023, 6:10 AM

## 2023-04-09 NOTE — Anesthesia Postprocedure Evaluation (Signed)
Anesthesia Post Note  Patient: Barbara Douglas  Procedure(s) Performed: RIGHT THYROID LOBECTOMY (Right)     Patient location during evaluation: PACU Anesthesia Type: General Level of consciousness: awake Pain management: pain level controlled Vital Signs Assessment: post-procedure vital signs reviewed and stable Respiratory status: spontaneous breathing, nonlabored ventilation and respiratory function stable Cardiovascular status: blood pressure returned to baseline and stable Postop Assessment: no apparent nausea or vomiting Anesthetic complications: no   No notable events documented.  Last Vitals:  Vitals:   04/09/23 0404 04/09/23 0410  BP: (!) 153/73   Pulse: 77 92  Resp: 18   Temp: 36.4 C   SpO2: 97% 95%    Last Pain:  Vitals:   04/09/23 0429  TempSrc:   PainSc: 0-No pain                 Shiana Rappleye P Maxima Skelton

## 2023-04-10 LAB — SURGICAL PATHOLOGY

## 2023-04-15 DIAGNOSIS — F411 Generalized anxiety disorder: Secondary | ICD-10-CM | POA: Diagnosis not present

## 2023-04-15 DIAGNOSIS — F4312 Post-traumatic stress disorder, chronic: Secondary | ICD-10-CM | POA: Diagnosis not present

## 2023-04-15 DIAGNOSIS — F331 Major depressive disorder, recurrent, moderate: Secondary | ICD-10-CM | POA: Diagnosis not present

## 2023-04-25 DIAGNOSIS — E041 Nontoxic single thyroid nodule: Secondary | ICD-10-CM | POA: Diagnosis not present

## 2023-05-12 DIAGNOSIS — F331 Major depressive disorder, recurrent, moderate: Secondary | ICD-10-CM | POA: Diagnosis not present

## 2023-05-12 DIAGNOSIS — F4312 Post-traumatic stress disorder, chronic: Secondary | ICD-10-CM | POA: Diagnosis not present

## 2023-05-12 DIAGNOSIS — F411 Generalized anxiety disorder: Secondary | ICD-10-CM | POA: Diagnosis not present

## 2023-05-15 ENCOUNTER — Other Ambulatory Visit: Payer: Self-pay | Admitting: Internal Medicine

## 2023-05-15 DIAGNOSIS — Z1231 Encounter for screening mammogram for malignant neoplasm of breast: Secondary | ICD-10-CM

## 2023-06-20 DIAGNOSIS — Z780 Asymptomatic menopausal state: Secondary | ICD-10-CM | POA: Diagnosis not present

## 2023-06-20 DIAGNOSIS — F5101 Primary insomnia: Secondary | ICD-10-CM | POA: Diagnosis not present

## 2023-06-20 DIAGNOSIS — F418 Other specified anxiety disorders: Secondary | ICD-10-CM | POA: Diagnosis not present

## 2023-06-20 DIAGNOSIS — E1169 Type 2 diabetes mellitus with other specified complication: Secondary | ICD-10-CM | POA: Diagnosis not present

## 2023-06-20 DIAGNOSIS — I10 Essential (primary) hypertension: Secondary | ICD-10-CM | POA: Diagnosis not present

## 2023-06-20 DIAGNOSIS — E041 Nontoxic single thyroid nodule: Secondary | ICD-10-CM | POA: Diagnosis not present

## 2023-07-28 ENCOUNTER — Ambulatory Visit
Admission: RE | Admit: 2023-07-28 | Discharge: 2023-07-28 | Disposition: A | Discharge status: Home / Self Care | Payer: BC Managed Care – PPO | Source: Ambulatory Visit | Attending: Internal Medicine

## 2023-07-28 DIAGNOSIS — Z1231 Encounter for screening mammogram for malignant neoplasm of breast: Secondary | ICD-10-CM | POA: Diagnosis not present

## 2023-08-01 DIAGNOSIS — G4733 Obstructive sleep apnea (adult) (pediatric): Secondary | ICD-10-CM | POA: Diagnosis not present

## 2023-08-27 IMAGING — MG MM DIGITAL SCREENING BILAT W/ TOMO AND CAD
8 of 14 series · 8 of 40 positions shown · non-contrast
Comparison: Previous exam(s).

ACR Breast Density Category a: The breast tissue is almost entirely
fatty.

CLINICAL DATA: Screening.

EXAM:
DIGITAL SCREENING BILATERAL MAMMOGRAM WITH TOMOSYNTHESIS AND CAD
TECHNIQUE: Bilateral screening digital craniocaudal and mediolateral oblique
mammograms were obtained. Bilateral screening digital breast
tomosynthesis was performed. The images were evaluated with
computer-aided detection.

[R CC synth-2D]
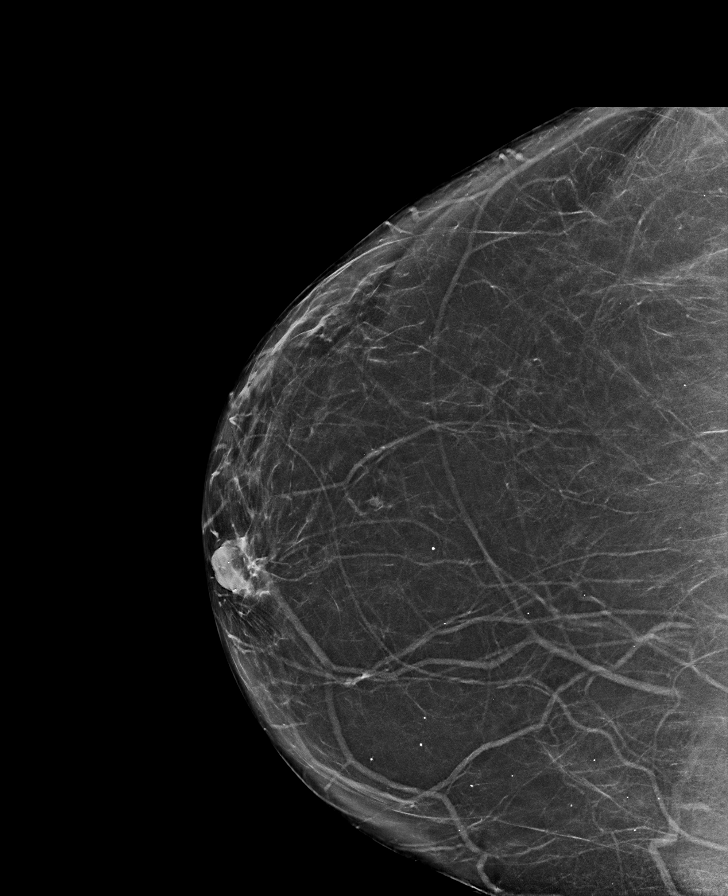

[L CC synth-2D]
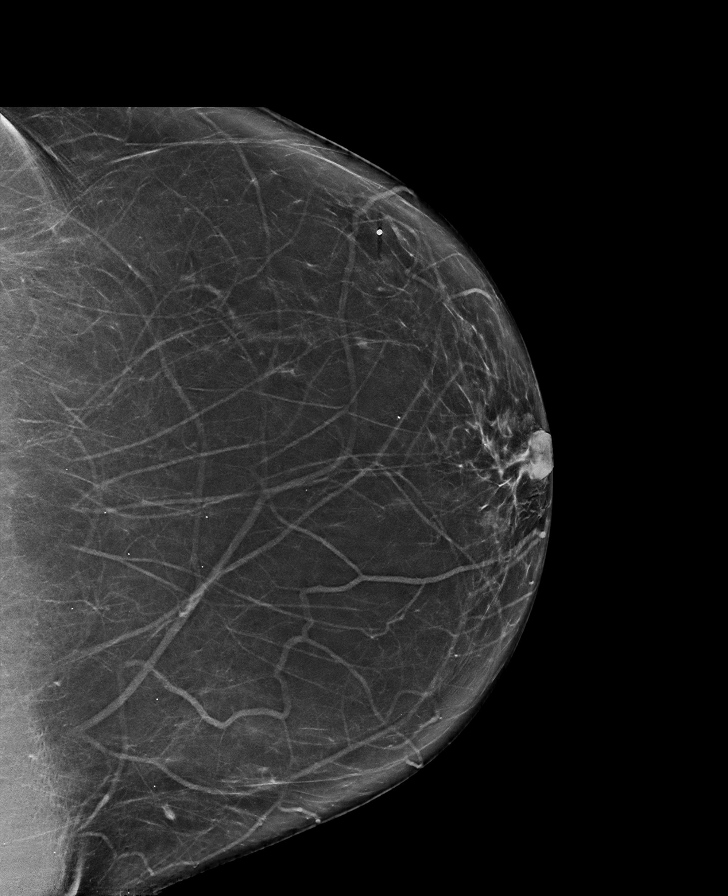

[R MLO synth-2D (1 of 2)]
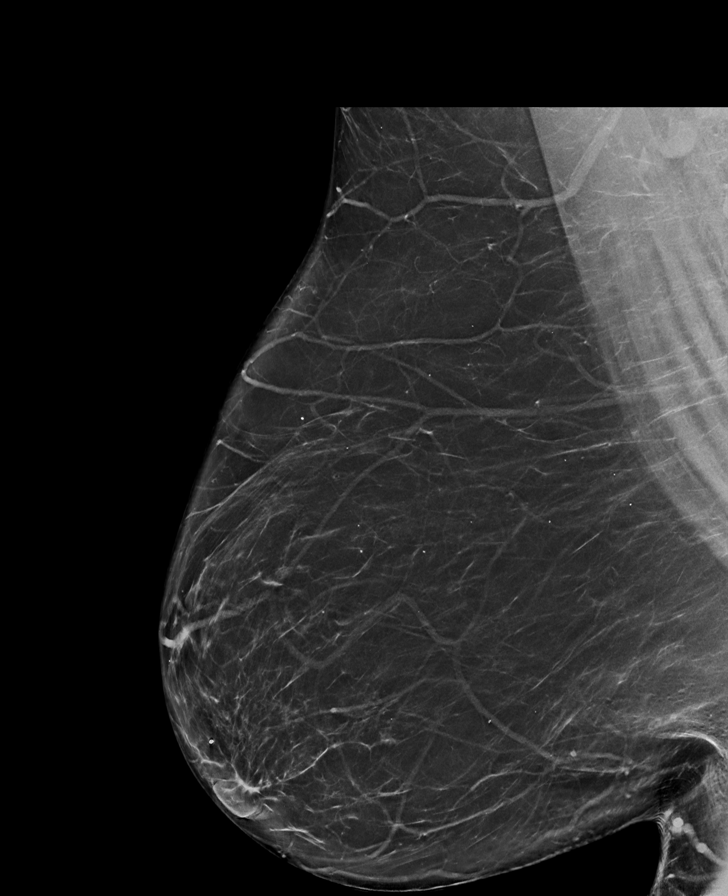

[R MLO synth-2D (2 of 2)]
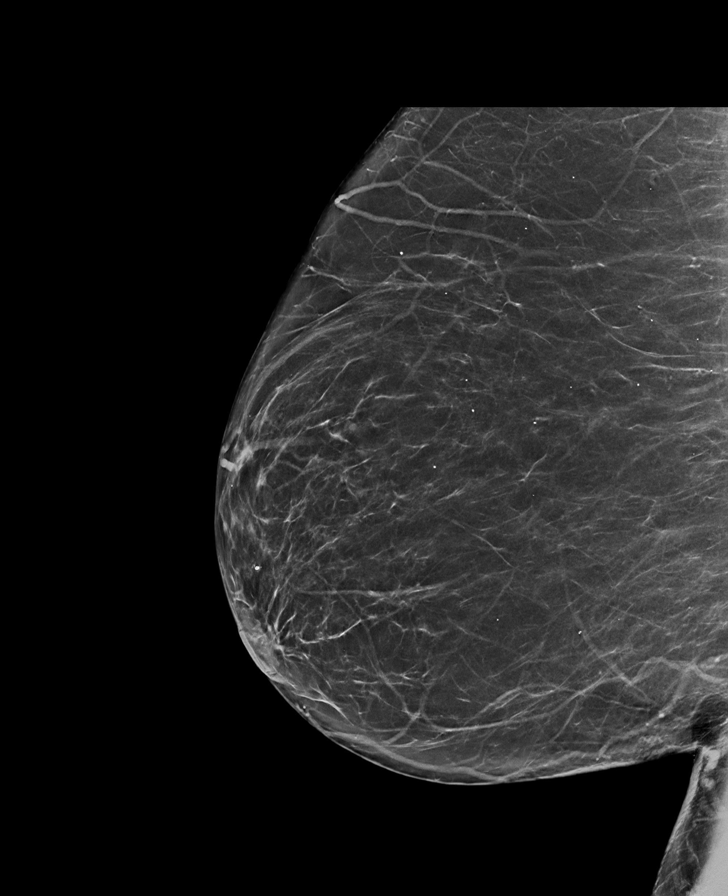

[L MLO synth-2D (1 of 2)]
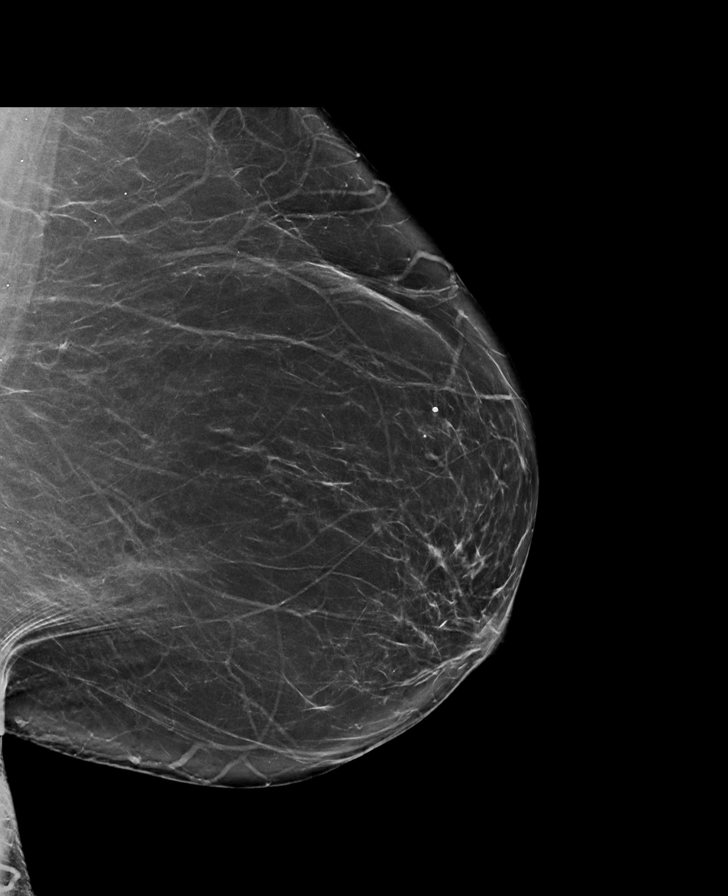

[R CV synth-2D]
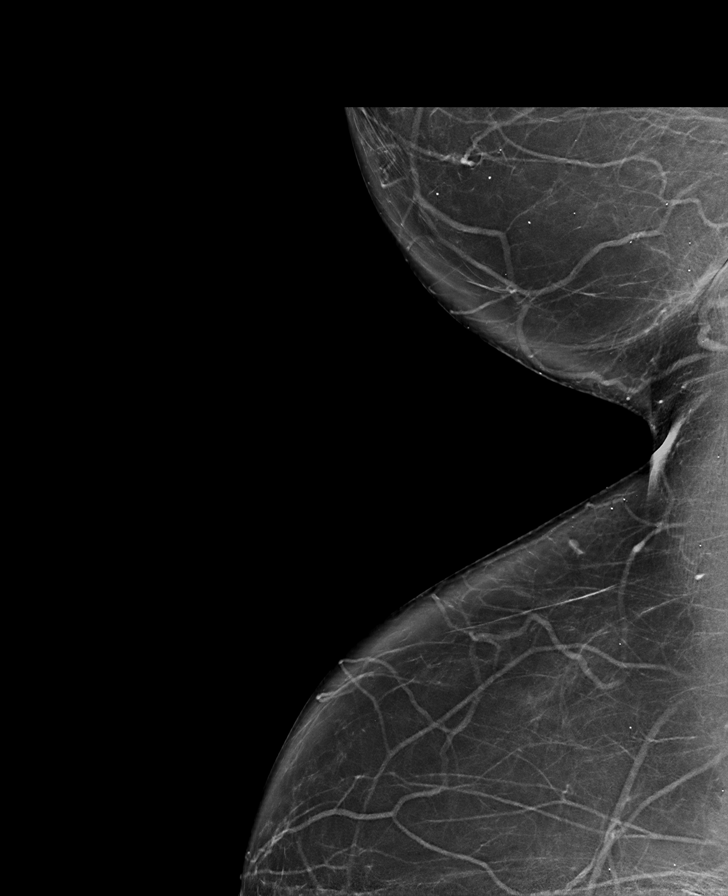

[L MLO synth-2D (2 of 2)]
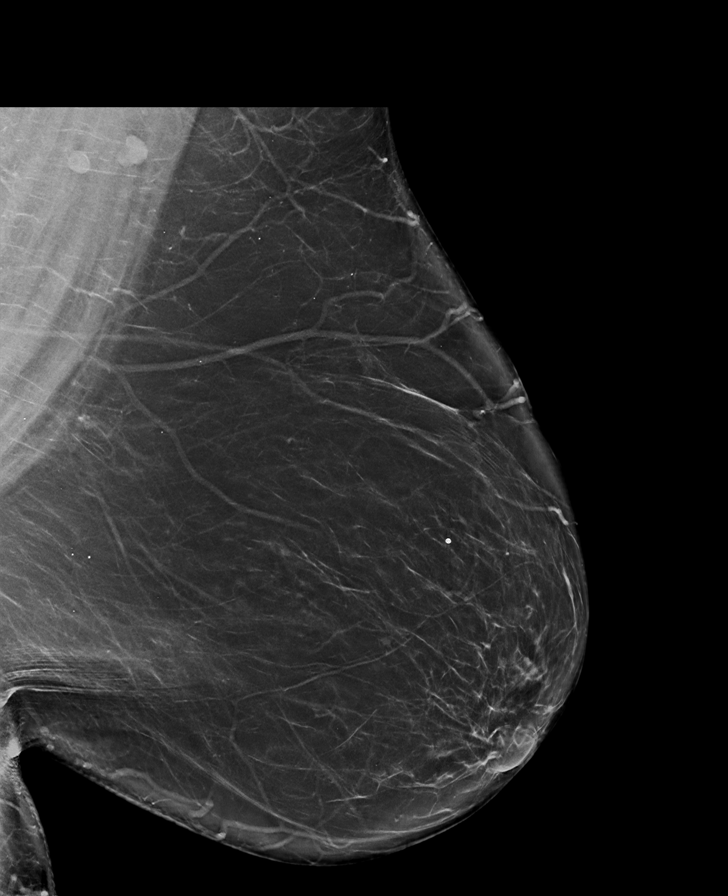

[R CV tomo · tomo slice 38/75.0]
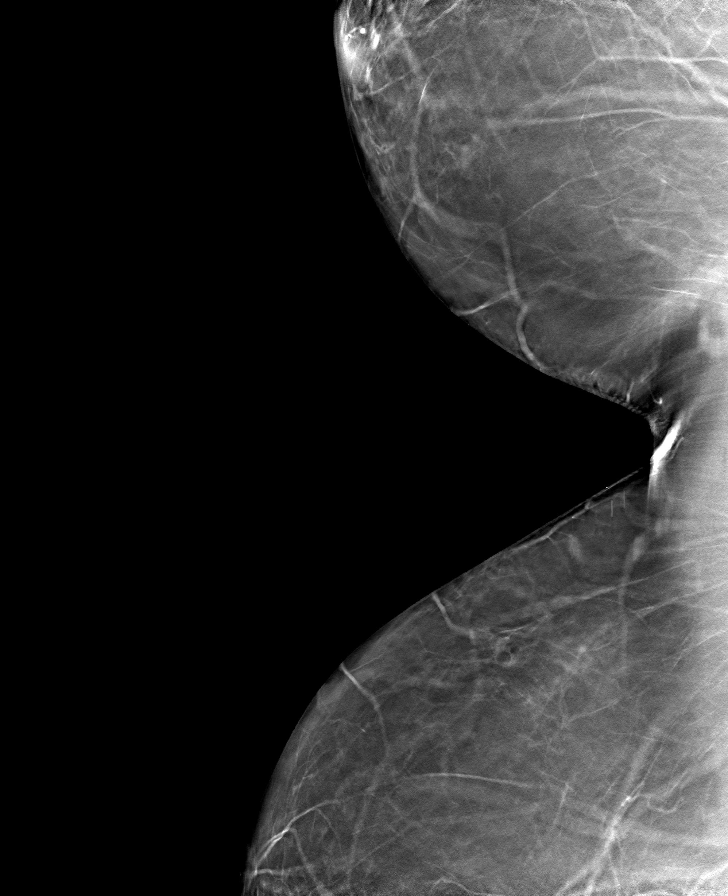

[8 of 40 positions shown; findings below may reference images not displayed]

FINDINGS: There are no findings suspicious for malignancy.
IMPRESSION: No mammographic evidence of malignancy. A result letter of this
screening mammogram will be mailed directly to the patient.

RECOMMENDATION:
Screening mammogram in one year. (Code:0E-3-N98)

BI-RADS CATEGORY  1: Negative.

## 2023-09-01 DIAGNOSIS — G4733 Obstructive sleep apnea (adult) (pediatric): Secondary | ICD-10-CM | POA: Diagnosis not present

## 2023-10-02 DIAGNOSIS — G4733 Obstructive sleep apnea (adult) (pediatric): Secondary | ICD-10-CM | POA: Diagnosis not present

## 2024-01-15 DIAGNOSIS — R0989 Other specified symptoms and signs involving the circulatory and respiratory systems: Secondary | ICD-10-CM | POA: Diagnosis not present

## 2024-01-22 DIAGNOSIS — I6523 Occlusion and stenosis of bilateral carotid arteries: Secondary | ICD-10-CM | POA: Diagnosis not present

## 2024-03-02 ENCOUNTER — Encounter: Payer: Self-pay | Admitting: Internal Medicine

## 2024-03-02 ENCOUNTER — Ambulatory Visit (INDEPENDENT_AMBULATORY_CARE_PROVIDER_SITE_OTHER)

## 2024-03-02 ENCOUNTER — Ambulatory Visit: Admitting: Internal Medicine

## 2024-03-02 VITALS — BP 140/82 | HR 79 | Temp 98.4°F | Ht 64.0 in | Wt 275.2 lb

## 2024-03-02 DIAGNOSIS — I700 Atherosclerosis of aorta: Secondary | ICD-10-CM | POA: Diagnosis not present

## 2024-03-02 DIAGNOSIS — J9811 Atelectasis: Secondary | ICD-10-CM | POA: Diagnosis not present

## 2024-03-02 DIAGNOSIS — R0902 Hypoxemia: Secondary | ICD-10-CM | POA: Diagnosis not present

## 2024-03-02 DIAGNOSIS — G4700 Insomnia, unspecified: Secondary | ICD-10-CM

## 2024-03-02 DIAGNOSIS — G4733 Obstructive sleep apnea (adult) (pediatric): Secondary | ICD-10-CM

## 2024-03-02 DIAGNOSIS — G4734 Idiopathic sleep related nonobstructive alveolar hypoventilation: Secondary | ICD-10-CM

## 2024-03-02 NOTE — Progress Notes (Signed)
 HPI  HPI F former smoker followed for OSA, allergic rhinitis, complicated by ETOH cirrhosis, tobacco, DM 2, HBP, gastroparesis, NPSG 04/28/02- AHI 20/ hr, desaturation to 83%, body weight 215 lbs PFT-10/20/2018- restriction of exhaled volume, minimal obsruction, no response to dilator, normal TLC, normal Diffusio ------------------------------------------------------------------------   10/20/2018-  58 year old female former smoker followed for OSA, Insomnia, COPD, allergic rhinitis, complicated by EtOH cirrhosis, DM 2, HBP, gastroparesis, Body weight today 309 lbs CPap 5-20/ [facility]>> today 8-20 Download compliance 100%, AHI 0.5/ hr -----breathing at baseline; on CPAP, going well, but feels like pressure settings may be too high  PFT 10/20/2018- restriction of exhaled volume, minimal obsruction, no response to dilator, normal TLC, normal Diffusion CXR 10/20/2018- Density RLLung zone, c/w atelectasis. Pending radiologist report CPAP starts too slowly for comfort. We are going to try raising the starting pressure. Occ notes wheezing if she turns her head. Remembers albuterol  rescue inhaler as more useful than current Flovent  disk. 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 She hopes a letter from us  would indicate she should work from home if possible, because she is at higher risk of covid-19 complication.   03/02/24- 57 year old female former smoker followed for OSA, Insomnia, COPD, allergic rhinitis, complicated by EtOH cirrhosis, DM 2, HBP, gastroparesis, Body weight today 275 lbs CPap 8-20/ [employer] Download compliance-100%, AHI 0.4/hr Cardiology follows carotid stenosis. Can't sleep without CPAP. FitBit tells her O2 sats falling to 83% in sleep. Frequent wakings. Sleepy in day. Takes Trazodone  300 mg and clonazepam  1 mg and gabapentin  330 mg at bedtime. Occasional morning coffee. Discussed need to reduce this. Weight down 39 lbs. Arrival O2 sat 96% room  air. Discussed the use of AI scribe software for clinical note transcription with the patient, who gave verbal consent to proceed.  History of Present Illness   Barbara Douglas is a 58 year old female with sleep apnea who presents with concerns about low oxygen levels during sleep.  Oxygen saturation during sleep is concerning, with a recorded level of 83% on her Fitbit. She uses a CPAP machine set between 8 and 20 cm of water pressure, with less than one breakthrough apnea per hour. Despite this, she experiences poor sleep quality, waking multiple times at night and feeling fatigued during the day. Morning oxygen saturation is typically 90-92%.  She experiences persistent daytime fatigue, feeling consistently sleepy and tired. She does not consume sedating medications during the day and has minimal caffeine intake. Nighttime medications include clonazepam  1 mg, trazodone  300 mg, and gabapentin .  She has a past history of smoking and a pulmonary function test five years ago showed restriction. She has not had a chest x-ray in five years. She receives COVID vaccinations every six months and plans to get an RSV shot at age 33.    History of Present Illness   Prior to Admission medications   Medication Sig Start Date End Date Taking? Authorizing Provider  albuterol  (PROVENTIL  HFA;VENTOLIN  HFA) 108 (90 Base) MCG/ACT inhaler Inhale 2 puffs into the lungs every 6 (six) hours as needed for wheezing or shortness of breath. 10/20/18  Yes Tabias Swayze D, MD  ARNUITY ELLIPTA 100 MCG/ACT AEPB Inhale 1 puff into the lungs daily. 11/28/22  Yes [provider]  clonazePAM  (KLONOPIN ) 1 MG tablet Take 1 tablet at bedtime as needed for sleep 12/03/16 03/02/24 Yes Harlem Thresher, Reggy D, MD  gabapentin  (NEURONTIN ) 300 MG capsule Take 600 mg by mouth at bedtime.   Yes [provider]  hydrochlorothiazide  (HYDRODIURIL ) 12.5 MG tablet Take 12.5 mg by mouth in the morning. 01/24/23  Yes [provider]  ibuprofen (ADVIL) 200 MG tablet Take 200 mg by mouth every 6 (six) hours as needed for fever, headache, mild pain, moderate pain or cramping.   Yes [provider]  lisinopril  (PRINIVIL ,ZESTRIL ) 10 MG tablet Take 10 mg by mouth daily.   Yes [provider]  Melatonin 10 MG TABS Take 10 mg by mouth at bedtime.   Yes [provider]  Semaglutide, 1 MG/DOSE, (OZEMPIC, 1 MG/DOSE,) 2 MG/1.5ML SOPN Inject 1 mg into the skin once a week.   Yes [provider]  traZODone  (DESYREL ) 150 MG tablet TAKE 2 TABLETS BY MOUTH AT BEDTIME 04/23/17  Yes Neysa Rama D, MD   Past Medical History:  Diagnosis Date   Allergic rhinitis    Anxiety    Arthritis    Back and hands   Asthma    Carotid artery stenosis    60-79% bilateral ICA stenosis 01/21/23   Cirrhosis of liver (HCC)    COPD (chronic obstructive pulmonary disease) (HCC)    Depression    Diabetes mellitus without complication (HCC)    Type 2   Gastroparesis    Hepatitis    Liver Cirrhosis   Hyperlipidemia    Hypertension    Insomnia    Irritable bowel syndrome    Obesity    Obstructive sleep apnea    Sciatic nerve pain    Past Surgical History:  Procedure Laterality Date   COLONOSCOPY WITH ESOPHAGOGASTRODUODENOSCOPY (EGD)  08/2020   THYROID  LOBECTOMY Right 04/08/2023   Procedure: RIGHT THYROID  LOBECTOMY;  Surgeon: Rubin Calamity, MD;  Location: [facility] OR;  Service: General;  Laterality: Right;   Uterine polyps     removed  12/2010   WISDOM TOOTH EXTRACTION     Family History  Problem Relation Age of Onset   Clotting disorder Mother    Lung cancer Maternal Aunt    Liver cancer Maternal Uncle    Heart disease Other        grandfather   Colon cancer Neg Hx    Social History   Socioeconomic History   Marital status: Single    Spouse name: Not on file   Number of children: Not on file   Years of education: Not on file   Highest education level: Not on file  Occupational History    Occupation: unemplyed    Comment: just lost her job at [employer]  Tobacco Use   Smoking status: Former    Current packs/day: 0.00    Types: Cigarettes    Quit date: 11/04/2007    Years since quitting: 16.3   Smokeless tobacco: Never   Tobacco comments:    pt advised it is best to quit smoking for her health; as of 10/20/2018, quit year & 3 mo ago  Vaping Use   Vaping status: Never Used  Substance and Sexual Activity   Alcohol  use: Not Currently    Comment: Sober for 10 years March 2024   Drug use: No   Sexual activity: Yes  Other Topics Concern   Not on file  Social History Narrative   Not on file   Social Drivers of Health   Financial Resource Strain: Not on file  Food Insecurity: No Food Insecurity (04/08/2023)   Hunger Vital Sign    Worried About Running Out of Food in the Last Year: Never true    Ran  Out of Food in the Last Year: Never true  Transportation Needs: No Transportation Needs (04/08/2023)   PRAPARE - Administrator, Civil Service (Medical): No    Lack of Transportation (Non-Medical): No  Physical Activity: Not on file  Stress: Not on file  Social Connections: Not on file  Intimate Partner Violence: Not At Risk (04/08/2023)   Humiliation, Afraid, Rape, and Kick questionnaire    Fear of Current or Ex-Partner: No    Emotionally Abused: No    Physically Abused: No    Sexually Abused: No  Assessment and Plan:    Obstructive sleep apnea with nocturnal hypoxemia Nocturnal hypoxemia with oxygen levels dropping to 83% as recorded by Fitbit. CPAP set between 8-20 cm H2O, primarily at 15 cm, controls apnea effectively. - Order overnight sleep study at [facility] to assess oxygen levels and determine need for supplemental oxygen. - Perform chest x-ray to evaluate lung function. - Review CPAP settings and download data to ensure proper function. - Consider reducing nighttime medications to decrease daytime sedation.  Insomnia and excessive  daytime sleepiness Daytime sleepiness potentially exacerbated by trazodone , clonazepam , and gabapentin , contributing to sedation. - Evaluate reducing nighttime medications to decrease daytime sedation. - Continue current sleep medications for sleep study, consider reducing gabapentin .     ROS-see HPI   + = positive Constitutional:    weight loss, night sweats, fevers, chills, fatigue, lassitude. HEENT:    +headaches, difficulty swallowing, tooth/dental problems, sore throat,       sneezing, itching, ear ache, nasal congestion, post nasal drip, snoring CV:    chest pain, orthopnea, PND, swelling in lower extremities, anasarca,                                   dizziness, palpitations Resp:   +shortness of breath with exertion or at rest.                +productive cough,   +non-productive cough, coughing up of blood.              change in color of mucus.  wheezing.   Skin:    rash or lesions. GI:  No-   heartburn, indigestion, abdominal pain, nausea, vomiting, diarrhea,                 change in bowel habits, loss of appetite GU: dysuria, change in color of urine, no urgency or frequency.   flank pain. MS:   joint pain, stiffness, decreased range of motion, back pain. Neuro-     nothing unusual Psych:  change in mood or affect.  +depression or +anxiety.   memory loss.  OBJ- Physical Exam General- Alert, Oriented, Affect-appropriate, Distress- none acute Skin- rash-none, lesions- none, excoriation- none Lymphadenopathy- none Head- atraumatic            Eyes- Gross vision intact, PERRLA, conjunctivae and secretions clear            Ears- Hearing, canals-normal            Nose- Clear, no-Septal dev, mucus, polyps, erosion, perforation             Throat- Mallampati II , mucosa clear , drainage- none, tonsils- atrophic Neck- flexible , trachea midline, no stridor , thyroid  nl, carotid no bruit Chest - symmetrical excursion , unlabored           Heart/CV- RRR , no  murmur , no gallop  , no  rub, nl s1 s2                           - JVD- none , edema- none, stasis changes- none, varices- none           Lung- clear to P&A, wheeze- none, cough- none , dullness-none, rub- none           Chest wall-  Abd-  Br/ Gen/ Rectal- Not done, not indicated Extrem- cyanosis- none, clubbing, none, atrophy- none, strength- nl Neuro- grossly intact to observation    ROS-see HPI     + = positive Constitutional:   No-   weight loss, night sweats, fevers, chills, fatigue, lassitude. HEENT:   No-  headaches, difficulty swallowing, tooth/dental problems, sore throat,       No-  sneezing, itching, ear ache, +nasal congestion, post nasal drip,  CV:  No-   chest pain, orthopnea, PND, swelling in lower extremities, anasarca, dizziness, palpitations Resp: No- acute  shortness of breath with exertion or at rest.              No-   productive cough,  Some non-productive cough,  No- coughing up of blood.              No-   change in color of mucus.  + wheezing.   Skin: No-   rash or lesions. GI:  No-   heartburn, indigestion, abdominal pain, nausea, vomiting,  GU:  MS:  No-   joint pain or swelling.  .   Neuro-     nothing unusual Psych:  No- change in mood or affect. Some depression or anxiety.  No memory loss.  OBJ- Physical Exam General- Alert, Oriented, Affect-appropriate, Distress- none acute, + morbidly obese Skin- rash-none, lesions- none, excoriation- none Lymphadenopathy- none Head- atraumatic            Eyes- Gross vision intact, PERRLA, conjunctivae and secretions clear            Ears- normal            Nose- +clear, no-Septal dev , polyps, erosion, perforation             Throat- Mallampati III-IV , mucosa clear , drainage- none, tonsils- atrophic Neck- flexible , trachea midline, no stridor , thyroid  nl, carotid no bruit Chest - symmetrical excursion , unlabored           Heart/CV- RRR , no murmur , no gallop  , no rub, nl s1 s2                           - JVD- none , edema- none,  stasis changes- none, varices- none           Lung- clear to P&A, wheeze- none, cough- none , dullness-none, rub- none           Chest wall-  Abd-  Br/ Gen/ Rectal- Not done, not indicated Extrem- cyanosis- none, clubbing, none, atrophy- none, strength- nl Neuro- grossly intact to observation

## 2024-03-02 NOTE — Patient Instructions (Signed)
 Order- CPAP titration sleep study, adding O2 if needed, dx OSA, Nocturnal Hypoxemia  Order- CXR  dx OSA, Nocturnal Hypoxemia

## 2024-03-08 DIAGNOSIS — E041 Nontoxic single thyroid nodule: Secondary | ICD-10-CM | POA: Diagnosis not present

## 2024-03-08 DIAGNOSIS — E7800 Pure hypercholesterolemia, unspecified: Secondary | ICD-10-CM | POA: Diagnosis not present

## 2024-03-08 DIAGNOSIS — E559 Vitamin D deficiency, unspecified: Secondary | ICD-10-CM | POA: Diagnosis not present

## 2024-03-08 DIAGNOSIS — I10 Essential (primary) hypertension: Secondary | ICD-10-CM | POA: Diagnosis not present

## 2024-03-08 DIAGNOSIS — Z0000 Encounter for general adult medical examination without abnormal findings: Secondary | ICD-10-CM | POA: Diagnosis not present

## 2024-03-08 DIAGNOSIS — F418 Other specified anxiety disorders: Secondary | ICD-10-CM | POA: Diagnosis not present

## 2024-03-08 DIAGNOSIS — E1169 Type 2 diabetes mellitus with other specified complication: Secondary | ICD-10-CM | POA: Diagnosis not present

## 2024-03-12 ENCOUNTER — Telehealth (HOSPITAL_BASED_OUTPATIENT_CLINIC_OR_DEPARTMENT_OTHER): Payer: Self-pay

## 2024-03-12 DIAGNOSIS — M51362 Other intervertebral disc degeneration, lumbar region with discogenic back pain and lower extremity pain: Secondary | ICD-10-CM | POA: Diagnosis not present

## 2024-03-12 NOTE — Telephone Encounter (Signed)
Raven can you check on this one

## 2024-03-12 NOTE — Telephone Encounter (Signed)
 Copied from CRM [id number]. Topic: Clinical - Medication Question >> Mar 11, 2024  9:33 AM Rozanna MATSU wrote: Reason for CRM: PT CALLING STATING SHE HAS NOT HEARD ANYTHING ON SCHEDULING HER SLEEP STUDY. PLEASE REACH OUT TO PT TO ADVISE. I DO SEE THE ORDER BUT SHE HAS NOT HEARD ANYTHING YET.

## 2024-03-15 NOTE — Telephone Encounter (Signed)
Patient is aware. Nothing further needed.  

## 2024-03-29 DIAGNOSIS — E039 Hypothyroidism, unspecified: Secondary | ICD-10-CM | POA: Diagnosis not present

## 2024-03-29 DIAGNOSIS — E041 Nontoxic single thyroid nodule: Secondary | ICD-10-CM | POA: Diagnosis not present

## 2024-04-21 ENCOUNTER — Other Ambulatory Visit: Payer: Self-pay | Admitting: Gastroenterology

## 2024-04-21 DIAGNOSIS — K7460 Unspecified cirrhosis of liver: Secondary | ICD-10-CM

## 2024-04-23 DIAGNOSIS — K7031 Alcoholic cirrhosis of liver with ascites: Secondary | ICD-10-CM | POA: Diagnosis not present

## 2024-04-23 DIAGNOSIS — E1169 Type 2 diabetes mellitus with other specified complication: Secondary | ICD-10-CM | POA: Diagnosis not present

## 2024-04-23 DIAGNOSIS — Z6841 Body Mass Index (BMI) 40.0 and over, adult: Secondary | ICD-10-CM | POA: Diagnosis not present

## 2024-04-26 DIAGNOSIS — E041 Nontoxic single thyroid nodule: Secondary | ICD-10-CM | POA: Diagnosis not present

## 2024-04-27 ENCOUNTER — Ambulatory Visit (HOSPITAL_BASED_OUTPATIENT_CLINIC_OR_DEPARTMENT_OTHER): Attending: Internal Medicine | Admitting: Internal Medicine

## 2024-04-27 ENCOUNTER — Ambulatory Visit
Admission: RE | Admit: 2024-04-27 | Discharge: 2024-04-27 | Disposition: A | Discharge status: Home / Self Care | Source: Ambulatory Visit | Attending: Gastroenterology | Admitting: Gastroenterology

## 2024-04-27 DIAGNOSIS — K7460 Unspecified cirrhosis of liver: Secondary | ICD-10-CM

## 2024-04-27 DIAGNOSIS — G4734 Idiopathic sleep related nonobstructive alveolar hypoventilation: Secondary | ICD-10-CM | POA: Diagnosis not present

## 2024-04-27 DIAGNOSIS — K8020 Calculus of gallbladder without cholecystitis without obstruction: Secondary | ICD-10-CM | POA: Diagnosis not present

## 2024-04-27 DIAGNOSIS — R188 Other ascites: Secondary | ICD-10-CM | POA: Diagnosis not present

## 2024-04-27 DIAGNOSIS — G4733 Obstructive sleep apnea (adult) (pediatric): Secondary | ICD-10-CM | POA: Diagnosis not present

## 2024-05-01 NOTE — Procedures (Signed)
 [facility] [address] [place], [place] [address] Tel: [phone]   Fax: [phone]  Titration Interpretation  Patient Name:  Barbara Douglas, Barbara Douglas Study Date:  04/27/2024 Referring Physician:  REGGY SALT [phone]) %%startinterp%% Indications for Polysomnography The patient is a 58 year old Female who is 5' 4 and weighs 280.0 lbs. Her BMI equals 48.4.  A full night titration treatment study was performed. Baseline NPSG 04/28/02- AHI 20/hr,desat to 83%, body weight 215 lbs  Medication was taken at 7:30 pm  -  Desyrel   Melatonin  Neurontin   Klonopin    Polysomnogram Data A full night polysomnogram recorded the standard physiologic parameters including EEG, EOG, EMG, EKG, nasal and oral airflow.  Respiratory parameters of chest and abdominal movements were recorded with Respiratory Inductance Plethysmography belts.  Oxygen saturation was recorded by pulse oximetry.   Sleep Architecture The total recording time of the polysomnogram was 385.5 minutes.  The total sleep time was 332.5 minutes.  The patient spent 1.8% of total sleep time in Stage N1, 22.7% in Stage N2, 54.7% in Stages N3, and 20.8% in REM.  Sleep latency was 10.0 minutes.  REM latency was 128.0 minutes.  Sleep Efficiency was 86.3%.  Wake after Sleep Onset time was 43.0 minutes.  Titration Summary The patient was titrated at pressures ranging from 8* cm/H20 with supplemental oxygen at - up to 9* cm/H20 with supplemental oxygen at -.  The last pressure used in the study was 9* cm/H20 with supplemental oxygen at -.  Respiratory Events The polysomnogram revealed a presence of - obstructive, - central, and - mixed apneas resulting in an Apnea index of - events per hour.  There were 3 hypopneas (>=3% desaturation and/or arousal) resulting in an Apnea\Hypopnea Index (AHI >=3% desaturation and/or arousal) of 0.5 events per hour.  There were 1 hypopneas (>=4% desaturation) resulting in an  Apnea\Hypopnea Index (AHI >=4% desaturation) of 0.2 events per hour.  There were 1 Respiratory Effort Related Arousals resulting in a RERA index of 0.2 events per hour. The Respiratory Disturbance Index is 0.7 events per hour.  The snore index was - events per hour.  Mean oxygen saturation was 91.9%.  The lowest oxygen saturation during sleep was 89.0%.  Time spent <=88% oxygen saturation was - minutes (-).  Limb Activity There were 134 limb movements recorded.  Of this total, 115 were classified as PLMs.  Of the PLMs, 18 were associated with arousals.  The Limb Movement index was 24.2 per hour while the PLM index was 20.8 per hour.  Cardiac Summary The average pulse rate was 68.4 bpm.  The minimum pulse rate was 58.0 bpm while the maximum pulse rate was 94.0 bpm.  Cardiac rhythm was normal.  Comments: CPAP titrated to 9 cwp, residual AHI (3%) 0/hr, saturation nadir 92%,mean 92.9%.  Diagnosis: Obstructive sleep apnea  Recommendations: Autopap or CPAP fixed pressure 9 cwp. Patient wore a small ResMed AirFit N20 nasal mask with heated humidification.   This study was personally reviewed and electronically signed by: SALT REGGY, MD Accredited Board Certified in Sleep Medicine Date/Time: 05/01/24  12:29   %%endinterp%%  Titration Report  Patient Name: Barbara Douglas, Barbara Douglas Study Date: 04/27/2024  Date of Birth: 09-24-1965 Study Type: CPAP Titration  Age: 71 year MRN #: [id number]  Sex: Female Interpreting Physician: SALT REGGY, [id number]  Height: 5' 4 Referring Physician: REGGY SALT [phone])  Weight: 280.0 lbs Recording Tech: Hargis Abu RPSGT RST  BMI: 48.4 Scoring Tech: Hargis Abu RPSGT RST  ESS: 7 Neck Size: 19  Mask Type AirFit N 20 Nasal Mask Final Pressure: 9CMH2O  Mask Size: Small Supplemental O2: -   Study Overview  Lights Off: 08:55:49 PM  Count Index  Lights On: 03:21:20 AM Awakenings: 12 2.2  Time in Bed: 385.5 min. Arousals: 81 14.6  Total Sleep Time: 332.5 min. AHI  (>=3% Desat and/or Ar.): 3 0.5   Sleep Efficiency: 86.3% AHI (>=4% Desat): 1 0.2   Sleep Latency: 10.0 min. Limb Movements: 134 24.2  Wake After Sleep Onset: 43.0 min. Snore: - -  REM Latency from Sleep Onset: 128.0 min. Desaturations: 5 0.9     Minimum SpO2 TST: 89.0%    Sleep Architecture  % of Time in Bed Stages Time (mins) % Sleep Time  Wake 53.0   Stage N1 6.0 1.8%  Stage N2 75.5 22.7%  Stage N3 182.0 54.7%  REM 69.0 20.8%   Arousal Summary   NREM REM Sleep Index  Respiratory Arousals 1 1 2  0.4  PLM Arousals 18 - 18 3.2  Isolated Limb Movement Arousals 2 - 2 0.4  Snore Arousals - - - -  Spontaneous Arousals 23 37 60 10.8  Total 43 38 81 14.6   Limb Movement Summary   Count Index  Isolated Limb Movements 19 3.4  Periodic Limb Movements (PLMs) 115 20.8  Total Limb Movements 134 24.2    Respiratory Summary   By Sleep Stage By Body Position Total   NREM REM Supine Non-Supine   Time (min) 263.5 69.0 - 332.5 332.5         Obstructive Apnea - - - - -  Mixed Apnea - - - - -  Central Apnea - - - - -  Total Apneas - - - - -  Total Apnea Index - - - - -         Hypopneas (>=3% Desat and/or Ar.) 2 1 - 3 3  AHI (>=3% Desat and/or Ar.) 0.5 0.9 - 0.5 0.5         Hypopneas (>=4% Desat) 1 - - 1 1  AHI (>=4% Desat) 0.2 - - 0.2 0.2          RERAs - 1 - 1 1  RERA Index - 0.9 - 0.2 0.2         RDI 0.5 1.7 - 0.7 0.7     Respiratory Event Durations   Apnea Hypopnea   NREM REM NREM REM  Average (seconds) - - 17.3 28.1  Maximum (seconds) - - 18.2 28.1    Oxygen Saturation Summary   Wake NREM REM TST TIB  Average SpO2 92.5% 91.7% 92.1% 91.8% 91.9%  Minimum SpO2 89.0% 89.0% 90.0% 89.0% 89.0%  Maximum SpO2 98.0% 96.0% 94.0% 96.0% 98.0%   Oxygen Saturation Distribution  Range (%) Time in range (min) Time in range (%)  90.0 - 100.0 352.3 93.3%  80.0 - 90.0 25.4 6.7%  70.0 - 80.0 - -  60.0 - 70.0 - -  50.0 - 60.0 - -  0.0 - 50.0 - -  Time Spent <=88%  SpO2  Range (%) Time in range (min) Time in range (%)  0.0 - 88.0 - -      Count Index  Desaturations 5 0.9    Cardiac Summary   Wake NREM REM Sleep Total  Average Pulse Rate (BPM) 71.2 67.9 68.3 68.0 68.4  Minimum Pulse Rate (BPM) 58.0 61.0 63.0 61.0 58.0  Maximum Pulse Rate (BPM) 94.0 78.0 74.0 78.0 94.0  Pulse Rate Distribution:  Range (bpm) Time in range (min) Time in range (%)  0.0 - 40.0 - -  40.0 - 60.0 0.7 0.2%  60.0 - 80.0 378.8 99.3%  80.0 - 100.0 1.9 0.5%  100.0 - 120.0 - -  120.0 - 140.0 - -  140.0 - 200.0 - -   Titration Summary  PAP Device PAP Level O2 Level Time (min) Wake (min) NREM (min) REM (min) Sleep Eff% OA# CA# MA# Hyp# (>=3%) AHI (>=3%) Hyp# (>=4%) AHI (>=%4) RERA RDI SpO2 <=88% (min) Min SpO2 Mean SpO2 Ar. Index  CPAP 8 - 340.0 38.5 232.5 69.0 88.7% - - - 3 0.6 1  0.2 1  0.8  0.0 89.0 91.7 15.5  CPAP 9 - 46.0 14.5 31.0 0.0 67.4% - - - - - -  - -  -  0.0 92.0 92.9 5.8    Hypnograms                           Technologist Comments  Patient was at Sleep Lab for OSA.  A CPAP Titration Study was ordered.  CPAP pressure started at [facility] and titrated to Cpap pressure of 9CMH2O with heated humidity and heated tubing.  Patient was fitted with a small AirFit N20 Nasal Mask. Patient tolerated CPAP Pressure well.  Respiratory events were eliminated.  Snoring was eliminated. Periodic Limb Movement was noted. EKG showed NSR. Patient took her medication at 7:30pm.  No oxygen was applied.                           Reggy Salt Diplomate, Biomedical engineer of Sleep Medicine  ELECTRONICALLY SIGNED ON:  05/01/2024, 12:19 PM [facility] PH: [phone]   FX: [phone] ACCREDITED BY THE AMERICAN ACADEMY OF SLEEP MEDICINE

## 2024-05-13 DIAGNOSIS — E039 Hypothyroidism, unspecified: Secondary | ICD-10-CM | POA: Diagnosis not present

## 2024-05-20 DIAGNOSIS — K7460 Unspecified cirrhosis of liver: Secondary | ICD-10-CM | POA: Diagnosis not present

## 2024-05-25 ENCOUNTER — Telehealth: Payer: Self-pay | Admitting: *Deleted

## 2024-05-25 DIAGNOSIS — G4733 Obstructive sleep apnea (adult) (pediatric): Secondary | ICD-10-CM

## 2024-05-25 NOTE — Telephone Encounter (Signed)
 Copied from CRM [id number]. Topic: Clinical - Lab/Test Results >> May 25, 2024  9:17 AM Essie A wrote: Reason for CRM: Patient had a sleep study done on 04/27/24 and has an appointment with Dr. Neysa on 06/03/24.  She saw her results on Mychart and had a concern about her oxygen level.  She would like for someone to call her to explain that.  Her phone number is [phone].  Thanks.   Called and spoke with the pt She is asking for sleep study results  I advised that the purpose of her upcoming visit here is to go over those results I offered sooner appt and he was unable to come sooner than 06/03/24  She wants to know what the results are now bc she is concerned she needs o2 added to CPAP  She is also needing new CPAP supplies and is unsure if she will need a new machine based on these results  Routing to Dr. Neysa for recommendations

## 2024-05-25 NOTE — Telephone Encounter (Signed)
 I called and spoke with the pt and notified of response from Dr. Neysa. She verbalized understanding. Order sent for new supplies. Nothing further needed.

## 2024-05-25 NOTE — Telephone Encounter (Signed)
 Result of CPAP titration.- CPAP at pressure of 9 gave good control of sleep apnea and she did not need supplemental oxygen. That means her current CPAP settings easily cover her pressure need. We don't need to replace the machine. Ok to order mask and supplies if she needs those refilled.

## 2024-05-31 NOTE — Progress Notes (Signed)
 HPI  HPI F former smoker followed for OSA, allergic rhinitis, complicated by ETOH cirrhosis, tobacco, DM 2, HBP, gastroparesis, NPSG 04/28/02- AHI 20/ hr, desaturation to 83%, body weight 215 lbs PFT-10/20/2018- restriction of exhaled volume, minimal obsruction, no response to dilator, normal TLC, normal Diffusio ------------------------------------------------------------------------  03/02/24- 58 year old female former smoker followed for OSA, Insomnia, COPD, allergic rhinitis, complicated by EtOH cirrhosis, DM 2, HBP, gastroparesis, Body weight today 275 lbs CPap 8-20/ [employer] Download compliance-100%, AHI 0.4/hr Cardiology follows carotid stenosis. Can't sleep without CPAP. FitBit tells her O2 sats falling to 83% in sleep. Frequent wakings. Sleepy in day. Takes Trazodone  300 mg and clonazepam  1 mg and gabapentin  330 mg at bedtime. Occasional morning coffee. Discussed need to reduce this. Weight down 39 lbs. Arrival O2 sat 96% room air. Discussed the use of AI scribe software for clinical note transcription with the patient, who gave verbal consent to proceed.  History of Present Illness   Barbara Douglas is a 58 year old female with sleep apnea who presents with concerns about low oxygen levels during sleep.  Oxygen saturation during sleep is concerning, with a recorded level of 83% on her Fitbit. She uses a CPAP machine set between 8 and 20 cm of water pressure, with less than one breakthrough apnea per hour. Despite this, she experiences poor sleep quality, waking multiple times at night and feeling fatigued during the day. Morning oxygen saturation is typically 90-92%.  She experiences persistent daytime fatigue, feeling consistently sleepy and tired. She does not consume sedating medications during the day and has minimal caffeine intake. Nighttime medications include clonazepam  1 mg, trazodone  300 mg, and gabapentin .  She has a past history of smoking and a pulmonary function test  five years ago showed restriction. She has not had a chest x-ray in five years. She receives COVID vaccinations every six months and plans to get an RSV shot at age 64.   Assessment and Plan:    Obstructive sleep apnea with nocturnal hypoxemia Nocturnal hypoxemia with oxygen levels dropping to 83% as recorded by Fitbit. CPAP set between 8-20 cm H2O, primarily at 15 cm, controls apnea effectively. - Order overnight sleep study at [facility] to assess oxygen levels and determine need for supplemental oxygen. - Perform chest x-ray to evaluate lung function. - Review CPAP settings and download data to ensure proper function. - Consider reducing nighttime medications to decrease daytime sedation.  Insomnia and excessive daytime sleepiness Daytime sleepiness potentially exacerbated by trazodone , clonazepam , and gabapentin , contributing to sedation. - Evaluate reducing nighttime medications to decrease daytime sedation. - Continue current sleep medications for sleep study, consider reducing gabapentin .    06/03/24-  58 year old female former smoker followed for OSA, Insomnia, COPD, allergic rhinitis, complicated by EtOH cirrhosis, DM 2, HBP, gastroparesis, Cardiology follows carotid stenosis. Can't sleep without CPAP CPAP titration 04/27/24- to 9 cwp, recorded weight 280 lbs Body weight today -279 lbs CPap 8-20/ [employer] Download compliance-100%, AHI 0.5/hr She had complained of daytime sleepiness and we were hoping to decrease sedating meds. Now says she is doing well and feels sleep is ok. No changes made.  CXR 03/02/24 IMPRESSION: No active cardiopulmonary disease.    ROS-see HPI   + = positive Constitutional:    weight loss, night sweats, fevers, chills, fatigue, lassitude. HEENT:    +headaches, difficulty swallowing, tooth/dental problems, sore throat,       sneezing, itching, ear ache, nasal congestion, post nasal drip, snoring CV:    chest pain, orthopnea,  PND, swelling  in lower extremities, anasarca,                                   dizziness, palpitations Resp:   +shortness of breath with exertion or at rest.                +productive cough,   +non-productive cough, coughing up of blood.              change in color of mucus.  wheezing.   Skin:    rash or lesions. GI:  No-   heartburn, indigestion, abdominal pain, nausea, vomiting, diarrhea,                 change in bowel habits, loss of appetite GU: dysuria, change in color of urine, no urgency or frequency.   flank pain. MS:   joint pain, stiffness, decreased range of motion, back pain. Neuro-     nothing unusual Psych:  change in mood or affect.  +depression or +anxiety.   memory loss.  OBJ- Physical Exam General- Alert, Oriented, Affect-appropriate, Distress- none acute, +obese Skin- rash-none, lesions- none, excoriation- none Lymphadenopathy- none Head- atraumatic            Eyes- Gross vision intact, PERRLA, conjunctivae and secretions clear            Ears- Hearing, canals-normal            Nose- Clear, no-Septal dev, mucus, polyps, erosion, perforation             Throat- Mallampati II , mucosa clear , drainage- none, tonsils- atrophic Neck- flexible , trachea midline, no stridor , thyroid  nl, carotid no bruit Chest - symmetrical excursion , unlabored           Heart/CV- RRR , no murmur , no gallop  , no rub, nl s1 s2                           - JVD- none , edema- none, stasis changes- none, varices- none           Lung- clear to P&A, wheeze- none, cough- none , dullness-none, rub- none           Chest wall-  Abd-  Br/ Gen/ Rectal- Not done, not indicated Extrem- cyanosis- none, clubbing, none, atrophy- none, strength- nl Neuro- grossly intact to observation

## 2024-06-03 ENCOUNTER — Ambulatory Visit: Admitting: Internal Medicine

## 2024-06-03 ENCOUNTER — Encounter: Payer: Self-pay | Admitting: Internal Medicine

## 2024-06-03 VITALS — BP 124/64 | HR 86 | Temp 98.6°F | Ht 63.5 in | Wt 279.4 lb

## 2024-06-03 DIAGNOSIS — J449 Chronic obstructive pulmonary disease, unspecified: Secondary | ICD-10-CM

## 2024-06-03 DIAGNOSIS — Z87891 Personal history of nicotine dependence: Secondary | ICD-10-CM | POA: Diagnosis not present

## 2024-06-03 DIAGNOSIS — G4733 Obstructive sleep apnea (adult) (pediatric): Secondary | ICD-10-CM | POA: Diagnosis not present

## 2024-06-03 DIAGNOSIS — Z23 Encounter for immunization: Secondary | ICD-10-CM

## 2024-06-03 DIAGNOSIS — G4700 Insomnia, unspecified: Secondary | ICD-10-CM

## 2024-06-03 NOTE — Patient Instructions (Signed)
 We can continue CPAP auto 8-20  Ok to continue current meds as discussed  Order- Prevnar 20 pneumonia vaccine

## 2024-06-04 ENCOUNTER — Telehealth: Payer: Self-pay | Admitting: Pharmacist

## 2024-06-04 NOTE — Progress Notes (Signed)
   06/04/2024  Patient ID: Barbara Douglas, female   DOB: 11-Dec-1965, 58 y.o.   MRN: [id number]  Received message from Dr. Vernon about ensuring it was okay for [facility] to add Carvedilol. Patient has portal HTN and adding med would prevent decompensation. Dr. March wanted to ensure that her asthma would not be a contraindication to nonselective beta-blocker and that it may require titration of pt other antihypertensive medication to prevent pt to be hypotensive. If med is not started pt would have to repeat endoscopy to screen for varices. Barbara Douglas will be faxing documents.    Response to PCP: Carvedilol is more selective than Propranolol or Nadolol as the other recommendations for gastroesophageal varices. I believe this is the best choice. Patient just needs to monitor if there are any new issues surrounding more frequent asthma attacks, but I have seen many patients do well on both together. Also patient has COPD and NOT asthma listed in chart, so this is a big difference as well.  Agree with discontinuing the HCTZ as well because may be replacing with Spironolactone :Furosemide  later on, if needed.    Barbara Douglas, PharmD, [facility] Phone Number: [phone]

## 2024-06-08 ENCOUNTER — Encounter: Payer: Self-pay | Admitting: Internal Medicine

## 2024-06-08 NOTE — Assessment & Plan Note (Signed)
 Have not refilled clonazepam . Encouraged to try off.

## 2024-06-08 NOTE — Assessment & Plan Note (Signed)
 Uncomplicated for now, with Arnuity Pneumonia vaccine updated at her request, after discusion

## 2024-06-08 NOTE — Assessment & Plan Note (Signed)
 Currently auto 8-20. There is room to decrease pressure, but she is comfortable with good control. Plan- no change to settings. Encourage weight control and good sleep habits.
